# Patient Record
Sex: Female | Born: 1970 | Race: White | Hispanic: No | Marital: Single | Smoking: Never smoker
Health system: Southern US, Community
[De-identification: ages and names within clinical notes are randomized; demographics above are authoritative.]

## PROBLEM LIST (undated history)

## (undated) DIAGNOSIS — E039 Hypothyroidism, unspecified: Secondary | ICD-10-CM

## (undated) DIAGNOSIS — T7840XA Allergy, unspecified, initial encounter: Secondary | ICD-10-CM

## (undated) DIAGNOSIS — R011 Cardiac murmur, unspecified: Secondary | ICD-10-CM

## (undated) HISTORY — DX: Allergy, unspecified, initial encounter: T78.40XA

## (undated) HISTORY — DX: Hypothyroidism, unspecified: E03.9

## (undated) HISTORY — PX: CERVICAL POLYPECTOMY: SHX88

## (undated) HISTORY — DX: Cardiac murmur, unspecified: R01.1

---

## 1996-10-30 ENCOUNTER — Encounter: Payer: Self-pay | Admitting: Internal Medicine

## 1998-04-08 ENCOUNTER — Other Ambulatory Visit: Admission: RE | Admit: 1998-04-08 | Discharge: 1998-04-08 | Payer: Self-pay | Admitting: Obstetrics & Gynecology

## 1999-07-28 ENCOUNTER — Other Ambulatory Visit: Admission: RE | Admit: 1999-07-28 | Discharge: 1999-07-28 | Payer: Self-pay | Admitting: Obstetrics & Gynecology

## 2000-09-30 ENCOUNTER — Other Ambulatory Visit: Admission: RE | Admit: 2000-09-30 | Discharge: 2000-09-30 | Payer: Self-pay | Admitting: Obstetrics and Gynecology

## 2002-01-22 ENCOUNTER — Other Ambulatory Visit: Admission: RE | Admit: 2002-01-22 | Discharge: 2002-01-22 | Payer: Self-pay | Admitting: Obstetrics and Gynecology

## 2003-02-08 ENCOUNTER — Other Ambulatory Visit: Admission: RE | Admit: 2003-02-08 | Discharge: 2003-02-08 | Payer: Self-pay | Admitting: Obstetrics and Gynecology

## 2004-02-09 ENCOUNTER — Other Ambulatory Visit: Admission: RE | Admit: 2004-02-09 | Discharge: 2004-02-09 | Payer: Self-pay | Admitting: Obstetrics and Gynecology

## 2004-09-15 ENCOUNTER — Ambulatory Visit: Payer: Self-pay | Admitting: Internal Medicine

## 2005-02-21 ENCOUNTER — Other Ambulatory Visit: Admission: RE | Admit: 2005-02-21 | Discharge: 2005-02-21 | Payer: Self-pay | Admitting: Obstetrics and Gynecology

## 2005-10-16 ENCOUNTER — Ambulatory Visit: Payer: Self-pay | Admitting: Internal Medicine

## 2005-10-23 ENCOUNTER — Ambulatory Visit: Payer: Self-pay | Admitting: Internal Medicine

## 2006-01-24 ENCOUNTER — Ambulatory Visit: Payer: Self-pay | Admitting: Internal Medicine

## 2006-01-28 ENCOUNTER — Ambulatory Visit: Payer: Self-pay | Admitting: Internal Medicine

## 2006-03-05 ENCOUNTER — Ambulatory Visit: Payer: Self-pay | Admitting: Internal Medicine

## 2006-07-04 ENCOUNTER — Ambulatory Visit: Payer: Self-pay | Admitting: Internal Medicine

## 2006-07-18 ENCOUNTER — Ambulatory Visit: Payer: Self-pay | Admitting: Internal Medicine

## 2007-01-10 ENCOUNTER — Ambulatory Visit: Payer: Self-pay | Admitting: Internal Medicine

## 2007-01-10 LAB — CONVERTED CEMR LAB
BUN: 12 mg/dL (ref 6–23)
Basophils Relative: 0.5 % (ref 0.0–1.0)
Bilirubin, Direct: 0.1 mg/dL (ref 0.0–0.3)
CO2: 28 meq/L (ref 19–32)
Calcium: 9 mg/dL (ref 8.4–10.5)
Eosinophils Absolute: 0.2 10*3/uL (ref 0.0–0.6)
Eosinophils Relative: 3.1 % (ref 0.0–5.0)
GFR calc Af Amer: 91 mL/min
GFR calc non Af Amer: 75 mL/min
Glucose, Bld: 86 mg/dL (ref 70–99)
HDL: 46.9 mg/dL (ref >39.0)
Hemoglobin: 12.9 g/dL (ref 12.0–15.0)
Lymphocytes Relative: 29.4 % (ref 12.0–46.0)
MCV: 89 fL (ref 78.0–100.0)
Monocytes Absolute: 0.3 10*3/uL (ref 0.2–0.7)
Monocytes Relative: 6.5 % (ref 3.0–11.0)
Neutro Abs: 3 10*3/uL (ref 1.4–7.7)
Platelets: 131 10*3/uL — ABNORMAL LOW (ref 150–400)
Potassium: 3.9 meq/L (ref 3.5–5.1)
TSH: 2.12 microintl units/mL (ref 0.35–5.50)
Total Protein: 7.2 g/dL (ref 6.0–8.3)
Triglycerides: 73 mg/dL (ref 0–149)
VLDL: 15 mg/dL (ref 0–40)

## 2007-01-17 ENCOUNTER — Ambulatory Visit: Payer: Self-pay | Admitting: Internal Medicine

## 2007-03-12 ENCOUNTER — Ambulatory Visit: Payer: Self-pay | Admitting: Endocrinology

## 2007-03-12 LAB — CONVERTED CEMR LAB
Basophils Absolute: 0 10*3/uL (ref 0.0–0.1)
Basophils Relative: 0.4 % (ref 0.0–1.0)
Eosinophils Relative: 1.8 % (ref 0.0–5.0)
HCT: 35.3 % — ABNORMAL LOW (ref 36.0–46.0)
Hemoglobin: 12.3 g/dL (ref 12.0–15.0)
Monocytes Absolute: 0.5 10*3/uL (ref 0.2–0.7)
Neutrophils Relative %: 68.9 % (ref 43.0–77.0)
RBC: 3.95 M/uL (ref 3.87–5.11)
RDW: 11.5 % (ref 11.5–14.6)
WBC: 8.2 10*3/uL (ref 4.5–10.5)

## 2007-05-09 ENCOUNTER — Encounter (INDEPENDENT_AMBULATORY_CARE_PROVIDER_SITE_OTHER): Payer: Self-pay | Admitting: Obstetrics and Gynecology

## 2007-05-09 ENCOUNTER — Ambulatory Visit (HOSPITAL_COMMUNITY): Admission: RE | Admit: 2007-05-09 | Discharge: 2007-05-09 | Payer: Self-pay | Admitting: Obstetrics and Gynecology

## 2007-10-08 ENCOUNTER — Ambulatory Visit: Payer: Self-pay | Admitting: Internal Medicine

## 2007-10-08 DIAGNOSIS — R519 Headache, unspecified: Secondary | ICD-10-CM | POA: Insufficient documentation

## 2007-10-08 DIAGNOSIS — M542 Cervicalgia: Secondary | ICD-10-CM | POA: Insufficient documentation

## 2007-10-08 DIAGNOSIS — R51 Headache: Secondary | ICD-10-CM

## 2007-10-08 DIAGNOSIS — E039 Hypothyroidism, unspecified: Secondary | ICD-10-CM

## 2008-04-29 ENCOUNTER — Telehealth: Payer: Self-pay | Admitting: *Deleted

## 2008-05-28 ENCOUNTER — Ambulatory Visit: Payer: Self-pay | Admitting: Internal Medicine

## 2008-05-28 DIAGNOSIS — F4321 Adjustment disorder with depressed mood: Secondary | ICD-10-CM

## 2008-11-12 ENCOUNTER — Telehealth: Payer: Self-pay | Admitting: *Deleted

## 2008-12-13 ENCOUNTER — Telehealth: Payer: Self-pay | Admitting: Internal Medicine

## 2008-12-13 ENCOUNTER — Ambulatory Visit: Payer: Self-pay | Admitting: Internal Medicine

## 2008-12-13 DIAGNOSIS — J309 Allergic rhinitis, unspecified: Secondary | ICD-10-CM

## 2008-12-13 DIAGNOSIS — J019 Acute sinusitis, unspecified: Secondary | ICD-10-CM

## 2008-12-13 LAB — CONVERTED CEMR LAB
Basophils Relative: 1 % (ref 0.0–3.0)
Eosinophils Relative: 0.8 % (ref 0.0–5.0)
HCT: 36.7 % (ref 36.0–46.0)
Hemoglobin: 12.9 g/dL (ref 12.0–15.0)
MCHC: 35.2 g/dL (ref 30.0–36.0)
MCV: 89.6 fL (ref 78.0–100.0)
Monocytes Absolute: 0.6 10*3/uL (ref 0.1–1.0)
Neutro Abs: 7.9 10*3/uL — ABNORMAL HIGH (ref 1.4–7.7)
Neutrophils Relative %: 78.6 % — ABNORMAL HIGH (ref 43.0–77.0)
RBC: 4.09 M/uL (ref 3.87–5.11)
WBC: 10.1 10*3/uL (ref 4.5–10.5)

## 2009-02-01 ENCOUNTER — Ambulatory Visit: Payer: Self-pay | Admitting: Internal Medicine

## 2009-02-01 DIAGNOSIS — J069 Acute upper respiratory infection, unspecified: Secondary | ICD-10-CM | POA: Insufficient documentation

## 2009-02-01 DIAGNOSIS — D696 Thrombocytopenia, unspecified: Secondary | ICD-10-CM

## 2009-02-01 HISTORY — DX: Thrombocytopenia, unspecified: D69.6

## 2009-05-13 ENCOUNTER — Ambulatory Visit: Payer: Self-pay | Admitting: Internal Medicine

## 2009-05-13 DIAGNOSIS — S99919A Unspecified injury of unspecified ankle, initial encounter: Secondary | ICD-10-CM

## 2009-05-13 DIAGNOSIS — S99929A Unspecified injury of unspecified foot, initial encounter: Secondary | ICD-10-CM

## 2009-05-13 DIAGNOSIS — S8990XA Unspecified injury of unspecified lower leg, initial encounter: Secondary | ICD-10-CM | POA: Insufficient documentation

## 2010-04-12 ENCOUNTER — Ambulatory Visit: Payer: Self-pay | Admitting: Internal Medicine

## 2010-04-12 LAB — CONVERTED CEMR LAB
Basophils Absolute: 0 10*3/uL (ref 0.0–0.1)
Basophils Relative: 0.4 % (ref 0.0–3.0)
Eosinophils Absolute: 0.2 10*3/uL (ref 0.0–0.7)
MCHC: 34.5 g/dL (ref 30.0–36.0)
MCV: 90.1 fL (ref 78.0–100.0)
Monocytes Absolute: 0.7 10*3/uL (ref 0.1–1.0)
Neutro Abs: 6.1 10*3/uL (ref 1.4–7.7)
Neutrophils Relative %: 69 % (ref 43.0–77.0)
RBC: 3.86 M/uL — ABNORMAL LOW (ref 3.87–5.11)
RDW: 12.2 % (ref 11.5–14.6)

## 2010-04-19 ENCOUNTER — Ambulatory Visit: Payer: Self-pay | Admitting: Internal Medicine

## 2010-04-19 DIAGNOSIS — R635 Abnormal weight gain: Secondary | ICD-10-CM | POA: Insufficient documentation

## 2010-04-19 DIAGNOSIS — R5381 Other malaise: Secondary | ICD-10-CM | POA: Insufficient documentation

## 2010-04-19 DIAGNOSIS — R5383 Other fatigue: Secondary | ICD-10-CM

## 2010-06-01 ENCOUNTER — Telehealth: Payer: Self-pay | Admitting: *Deleted

## 2010-09-20 ENCOUNTER — Ambulatory Visit
Admission: RE | Admit: 2010-09-20 | Discharge: 2010-09-20 | Payer: Self-pay | Source: Home / Self Care | Attending: Internal Medicine | Admitting: Internal Medicine

## 2010-10-24 NOTE — Progress Notes (Signed)
Summary: refill  Phone Note From Pharmacy   Caller: [facility]* Reason for Call: Needs renewal Details for Reason: Fluoxetine 10mg   Initial call taken by: Romualdo Bolk, CMA Duncan Dull),  June 01, 2010 12:05 PM  Follow-up for Phone Call        Rx sent to pharmacy Follow-up by: Romualdo Bolk, CMA (AAMA),  June 01, 2010 12:06 PM    Prescriptions: FLUOXETINE HCL 10 MG CAPS (FLUOXETINE HCL) 1 by mouth once daily  #30 x 4   Entered by:   Romualdo Bolk, CMA (AAMA)   Authorized by:   Madelin Headings MD   Signed by:   Romualdo Bolk, CMA (AAMA) on 06/01/2010   Method used:   Electronically to        [facility]* (retail)       [address] [place], [place]  [address]       Ph: [phone]       Fax: [phone]   RxID:   [id number]

## 2010-10-24 NOTE — Assessment & Plan Note (Signed)
Summary: follow up on labs/ssc   Vital Signs:  Patient profile:   40 year old female Menstrual status:  regular LMP:     03/29/2010 Height:      64.25 inches Weight:      159 pounds BMI:     27.18 Pulse rate:   72 / minute BP sitting:   110 / 70  (right arm) Cuff size:   regular  Vitals Entered By: Romualdo Bolk, CMA (AAMA) (April 19, 2010 4:16 PM) CC: Follow-up visit on labs LMP (date): 03/29/2010 LMP - Character: normal Menarche (age onset years): 12   Menses interval (days): 28 Menstrual flow (days): 4 Enter LMP: 03/29/2010   History of Present Illness: Lisa Bradford comes in today  for follow up of thyroid and  blood countthat showed somewhat low plt count in the  past. Since last visit  here  there have been no major changes in health status  and no bleeding or unsual illness. No change in thyroid med and is on brand   . She is tired a good bit but no cv pulm localization. Mood Stable wants to stay on med  Weight gain  :  habit change   but no edema   has been on ocps per Dr Su Hilt .    Preventive Screening-Counseling & Management  Alcohol-Tobacco     Alcohol drinks/day: <1     Smoking Status: never  Caffeine-Diet-Exercise     Caffeine use/day: 2     Does Patient Exercise: no  Current Medications (verified): 1)  Synthroid 75 Mcg Tabs (Levothyroxine Sodium) .Marland Kitchen.. 1 Tablet By Mouth Once A Day. 2)  Cyclobenzaprine Hcl 10 Mg  Tabs (Cyclobenzaprine Hcl) .... 1/2 To 1 Three Times A Day As Needed For Neck Pain. 3)  Flonase 50 Mcg/act Susp (Fluticasone Propionate) .... 2 Sprays Each Nares Q D      For Allergy and Sinusitis 4)  Fluoxetine Hcl 10 Mg Caps (Fluoxetine Hcl) .Marland Kitchen.. 1 By Mouth Once Daily  Allergies (verified): No Known Drug Allergies  Past History:  Past medical, surgical, family and social histories (including risk factors) reviewed for relevance to current acute and chronic problems.  Past Medical History: Reviewed history from 02/01/2009 and no  changes required. Hypothyroidism  goiter with positive antibodies heart murmur echo in 98 and hx seasonal allergies and a Low p[atelet count x 2 2009   CONSULTANTS  Dr  Osborn Coho gyne  Past Surgical History: Reviewed history from 10/08/2007 and no changes required. Cervical and Endometrial Polyps removed  Past History:  Care Management: Gynecology: Dr. Su Hilt  Family History: Reviewed history from 05/28/2008 and no changes required. Family History of Cervical cancer Mom Family History Lung cancer- Grandmother and Grandfather- Maternal Family History Ovarian cancer- Mom Family History Other cancer- Pancreatic Cancer- Grandfather- maternal Family History Diabetes 1st degree relative- Father and Paternal Grandfather and Maternal Grandfather  Father with Heart   stent  CAD  age 14  Social History: Reviewed history from 02/01/2009 and no changes required. Single Never Smoked Alcohol use-no   Drug use-no Regular exercise-no HH of 1   no pets  college degree  Review of Systems  The patient denies anorexia, fever, weight loss, vision loss, decreased hearing, chest pain, peripheral edema, difficulty walking, depression, abnormal bleeding, enlarged lymph nodes, and angioedema.         skin on heel   right foot  colred no pain or injury  Physical Exam  General:  Well-developed,well-nourished,in no acute distress;  alert,appropriate and cooperative throughout examination Head:  normocephalic and atraumatic.   Eyes:  some prottosis and no lid lag.  Neck:  No deformities, , or tenderness noted. goiter non tender  Lungs:  Normal respiratory effort, chest expands symmetrically. Lungs are clear to auscultation, no crackles or wheezes. Heart:  Normal rate and regular rhythm. S1 and S2 normal without gallop, murmur, click, rub or other extra sounds. Abdomen:  Bowel sounds positive,abdomen soft and non-tender without masses, organomegaly or   noted. Pulses:  pulses intact  without delay   Extremities:  no clubbing cyanosis or edema  Neurologic:  non focal  Skin:  righ foor 1 cm blanching blood flat rash with  skin lines non tender and not indureated  Cervical Nodes:  No lymphadenopathy noted Psych:  Oriented X3, normally interactive, and good eye contact.     Impression & Recommendations:  Problem # 1:  HYPOTHYROIDISM (ICD-244.9)  Her updated medication list for this problem includes:    Synthroid 75 Mcg Tabs (Levothyroxine sodium) .Marland Kitchen... 1 tablet by mouth once a day.  Labs Reviewed: TSH: 2.67 (04/12/2010)    Chol: 149 (01/10/2007)   HDL: 46.9 (01/10/2007)   LDL: 88 (01/10/2007)   TG: 73 (01/10/2007)  Problem # 2:  WEIGHT GAIN (ICD-783.1) counseled    20# in a year    tsh does not explain this . suppose prozac could contribute but not in the past .  lifestyle intervention  rec   Problem # 3:  MALAISE AND FATIGUE (ICD-780.79) prob combo related  to weight gain etc    no obv cause  otherwise   disc  this   Problem # 4:  periods   on   generic  equivalent of ocp ?  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 1 tablet by mouth once a day. 2)  Cyclobenzaprine Hcl 10 Mg Tabs (Cyclobenzaprine hcl) .... 1/2 to 1 three times a day as needed for neck pain. 3)  Flonase 50 Mcg/act Susp (Fluticasone propionate) .... 2 sprays each nares q d      for allergy and sinusitis 4)  Fluoxetine Hcl 10 Mg Caps (Fluoxetine hcl) .Marland Kitchen.. 1 by mouth once daily  Patient Instructions: 1)  You need to use up 3500 calories more than intake to lose one pound of body weight.  2)  look at nutrition and exercise. 3)  Recheck  TSH in 6 months    .   Prescriptions: SYNTHROID 75 MCG TABS (LEVOTHYROXINE SODIUM) 1 tablet by mouth once a day. Brand medically necessary #30 Tablet x 12   Entered  and Authorized by:   Madelin Headings MD   Signed by:   Madelin Headings MD on 04/19/2010   Method used:   Print then Give to Patient   RxID:   [id number]

## 2010-10-26 NOTE — Assessment & Plan Note (Signed)
Summary: ?fungal inf underneath toenail/cjr   Vital Signs:  Patient profile:   40 year old female Menstrual status:  regular LMP:     08/30/2010 Weight:      160 pounds Pulse rate:   72 / minute BP sitting:   120 / 80  (right arm) Cuff size:   regular  Vitals Entered By: Romualdo Bolk, CMA (AAMA) (September 20, 2010 3:23 PM) CC: ? Fungus on Rt big- Has been there for several months LMP (date): 08/30/2010 LMP - Character: normal Menarche (age onset years): 12   Menses interval (days): 28 Menstrual flow (days): 4 Enter LMP: 08/30/2010   History of Present Illness: Lisa Bradford   comes in today  for acute problem that has been there for a few months. toe nail right great discolored and thickened and ? some worse. denies trauma but goes for reg pedicures and keeps naisl painted .   no other nail involved except a slight change in left great toe. NO pain  Preventive Screening-Counseling & Management  Alcohol-Tobacco     Alcohol drinks/day: <1     Smoking Status: never  Caffeine-Diet-Exercise     Caffeine use/day: 2     Does Patient Exercise: no  Current Medications (verified): 1)  Synthroid 75 Mcg Tabs (Levothyroxine Sodium) .Marland Kitchen.. 1 Tablet By Mouth Once A Day. 2)  Cyclobenzaprine Hcl 10 Mg  Tabs (Cyclobenzaprine Hcl) .... 1/2 To 1 Three Times A Day As Needed For Neck Pain. 3)  Flonase 50 Mcg/act Susp (Fluticasone Propionate) .... 2 Sprays Each Nares Q D      For Allergy and Sinusitis 4)  Fluoxetine Hcl 10 Mg Caps (Fluoxetine Hcl) .Marland Kitchen.. 1 By Mouth Once Daily  Allergies (verified): No Known Drug Allergies  Past History:  Past medical, surgical, family and social histories (including risk factors) reviewed for relevance to current acute and chronic problems.  Past Medical History: Reviewed history from 02/01/2009 and no changes required. Hypothyroidism  goiter with positive antibodies heart murmur echo in 98 and hx seasonal allergies and a Low p[atelet count x 2  2009   CONSULTANTS  Dr  Osborn Coho gyne  Past Surgical History: Reviewed history from 10/08/2007 and no changes required. Cervical and Endometrial Polyps removed  Past History:  Care Management: Gynecology: Dr. Su Hilt  Family History: Reviewed history from 05/28/2008 and no changes required. Family History of Cervical cancer Mom Family History Lung cancer- Grandmother and Grandfather- Maternal Family History Ovarian cancer- Mom Family History Other cancer- Pancreatic Cancer- Grandfather- maternal Family History Diabetes 1st degree relative- Father and Paternal Grandfather and Maternal Grandfather  Father with Heart   stent  CAD  age 29  Social History: Reviewed history from 02/01/2009 and no changes required. Single Never Smoked Alcohol use-no   Drug use-no Regular exercise-no HH of 1   no pets  college degree  Review of Systems  The patient denies anorexia, fever, abnormal bleeding, enlarged lymph nodes, and angioedema.    Physical Exam  General:  Well-developed,well-nourished,in no acute distress; alert,appropriate and cooperative throughout examination Lungs:  normal respiratory effort and no intercostal retractions.   Msk:  no joint swelling and no joint warmth.   Skin:  right great toe with  irreg yellow color and some mild thickening  no redness and nail base is clean . rest of nails normal  excep slight whit are on left great toenail  Psych:  Oriented X3, good eye contact, and not anxious appearing.     Impression & Recommendations:  Problem # 1:  ? of DERMATOPHYTOSIS OF NAIL (ICD-110.1) disc options rx and culture if needed    must avoid the pedicures and painting and will take a while to resolve for now conservative rx  disc use of lamisil and need for more intensive rx    Her updated medication list for this problem includes:    Ciclopirox 8 % Soln (Ciclopirox) .Marland Kitchen... Apply to affected nail once daily for 7 days then remove with alcohol and repeat.     Complete Medication List: 1)  Synthroid 75 Mcg Tabs (Levothyroxine sodium) .Marland Kitchen.. 1 tablet by mouth once a day. 2)  Cyclobenzaprine Hcl 10 Mg Tabs (Cyclobenzaprine hcl) .... 1/2 to 1 three times a day as needed for neck pain. 3)  Flonase 50 Mcg/act Susp (Fluticasone propionate) .... 2 sprays each nares q d      for allergy and sinusitis 4)  Fluoxetine Hcl 10 Mg Caps (Fluoxetine hcl) .Marland Kitchen.. 1 by mouth once daily 5)  Ciclopirox 8 % Soln (Ciclopirox) .... Apply to affecgted nail once daily for 7 days then remove with alcohol and repeat.  Patient Instructions: 1)  avoid pedicures and polish or other chemical s except treatment keep nails clipped carefully. 2)  if not growing out in 3 months or  co wish other treatment call  for reevaluation. Prescriptions: CICLOPIROX 8 % SOLN (CICLOPIROX) apply to affecgted nail once daily for 7 days then remove with alcohol and repeat.  #1 bottle x 2   Entered and Authorized by:   Madelin Headings MD   Signed by:   Madelin Headings MD on 09/20/2010   Method used:   Electronically to        [facility]* (retail)       [address] [place], [place]  [address]       Ph: [phone]       Fax: [phone]   RxID:   [id number]    Orders Added: 1)  Est. Patient Level III [id number]

## 2011-01-29 ENCOUNTER — Telehealth: Payer: Self-pay | Admitting: *Deleted

## 2011-01-29 MED ORDER — FLUOXETINE HCL 10 MG PO CAPS: 10.0000 mg | ORAL_CAPSULE | Freq: Every day | ORAL | Status: DC

## 2011-01-29 NOTE — Telephone Encounter (Signed)
Pt is due for a TSH. Letter mailed to pt to get this done.

## 2011-02-06 NOTE — Op Note (Signed)
NAMEAVE, SCHARNHORST             ACCOUNT NO.:  [id number]   MEDICAL RECORD NO.:  [id number]          PATIENT TYPE:  AMB   LOCATION:  SDC                           FACILITY:  [facility]   PHYSICIAN:  Osborn Coho, M.D.   DATE OF BIRTH:  10/19/1970   DATE OF PROCEDURE:  05/09/2007  DATE OF DISCHARGE:                               OPERATIVE REPORT   PREOPERATIVE DIAGNOSIS:  Metrorrhagia.   POSTOPERATIVE DIAGNOSIS:  Metrorrhagia.   PROCEDURE:  1. Hysteroscopy.  2. Dilation and curettage.   ATTENDING:  Osborn Coho, M.D.   ANESTHESIA:  General via LMA.   FINDINGS:  Polypoid tissue.   SPECIMENS TO PATHOLOGY:  Endometrial curettings.   FLUIDS:  1000 mL.   URINE OUTPUT:  Approximately 100 mL via straight catheterization prior  to procedure.   ESTIMATED BLOOD LOSS:  Minimal.   COMPLICATIONS:  None.   FINDINGS:  Uterus sounded to approximately 8 cm.   PROCEDURE:  The patient was taken to the operating room after the risks,  benefits and alternatives were reviewed with the patient.  The patient  verbalized understanding and consent signed and witnessed.  The patient  was placed under general per Anesthesia and prepped and draped in the  normal sterile fashion in the dorsal lithotomy position.  A bivalve  speculum placed in the patient's vagina and the anterior lip of the  cervix grasped with a single-tooth tenaculum.  A paracervical block was  administered using a total of 10 mL of 1% lidocaine.  The cervix was  dilated for passage of the hysteroscope after the uterus was sounded to  8 cm.  The hysteroscope was introduced and polypoid tissue noted.  Gentle curettage was performed until a gritty texture was noted.  Hysteroscope was  reintroduced and no polyps remained.  All instruments were removed.  There was good hemostasis at tenaculum sites.  Count was correct.  The  patient tolerated procedure well and was returned to the recovery room  in good condition.      Osborn Coho, M.D.  Electronically Signed     AR/MEDQ  D:  05/09/2007  T:  05/10/2007  Job:  [id number]

## 2011-02-06 NOTE — Consult Note (Signed)
[facility]                          ENDOCRINOLOGY CONSULTATION   NAME:Lisa Bradford, Lisa Bradford                    MRN:          [id number]  DATE:03/12/2007                            DOB:          08-Apr-1971    REFERRING PHYSICIAN:  Neta Mends. Panosh, MD   HISTORY OF PRESENT ILLNESS:  A 40 year old woman who reports a 4-year  history of hypothyroidism.  She has been on Synthroid 75 mcg a day for  several years.  She feels well in general, but she has several years of  slight prominence of the left eye without any associated prominence of  the thyroid.   PAST MEDICAL HISTORY:  Contraception.   MEDICATIONS:  Only the Synthroid 75 mcg a day.   SOCIAL HISTORY:  She is single.  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   FAMILY HISTORY:  Her mother has a history of hypothyroidism.   REVIEW OF SYSTEMS:  She has slight weight gain over the past few years.  She denies fever, excessive diaphoresis, palpitations and tremor.   PHYSICAL EXAMINATION:  VITAL SIGNS:  Blood pressure 90/59, heart rate  71, temperature 98.3.  The weight is 143.  GENERAL:  Healthy-appearing young woman in no distress.  SKIN:  No rash.  Not diaphoretic.  On her anterior tibial area, she has  no dermopathy.  HEENT:  Eyes - slight proptosis, left eye greater than the right.  NECK:  Thyroid minimally enlarged on the right.  No nodule.  CHEST:  Clear to auscultation.  No respiratory distress.  CARDIOVASCULAR:  No edema.  Regular rate and rhythm.  No murmur.  NEUROLOGIC:  Alert and oriented.  Does not appear anxious nor depressed.  Sensation is intact to touch on the feet.   LABORATORY DATA:  Laboratory studies forwarded by Dr. Fabian Sharp on January 10, 2007 revealed TSH normal at 2.1, platelets 131.   IMPRESSION:  1. Well-replaced chronic hypothyroidism.  2. Slight left-sided proptosis.  3. Thrombocytopenia, resolved.  This is usually auto-immune.   PLAN:  1. Same Synthroid.  2. She should have an annual physical examination of the thyroid and      an annual TSH.  3. Return here p.r.n.  4. We had a discussion about the natural history of thyroid eye      disease.  I have told her that there is no safe and effective      therapy for this, but fortunately episodes of this threatening      someone's vision are extremely rare.     Sean A. Everardo All, MD  Electronically Signed    SAE/MedQ  DD: 03/16/2007  DT: 03/17/2007  Job #: [id number]   cc:   Neta Mends. Fabian Sharp, MD

## 2011-02-07 ENCOUNTER — Other Ambulatory Visit: Payer: Self-pay

## 2011-02-12 ENCOUNTER — Other Ambulatory Visit (INDEPENDENT_AMBULATORY_CARE_PROVIDER_SITE_OTHER): Payer: Self-pay

## 2011-02-12 ENCOUNTER — Other Ambulatory Visit: Payer: Self-pay

## 2011-02-12 DIAGNOSIS — E039 Hypothyroidism, unspecified: Secondary | ICD-10-CM

## 2011-02-12 LAB — TSH: TSH: 3.17 u[IU]/mL (ref 0.35–5.50)

## 2011-02-26 ENCOUNTER — Encounter: Payer: Self-pay | Admitting: *Deleted

## 2011-02-27 ENCOUNTER — Telehealth: Payer: Self-pay | Admitting: *Deleted

## 2011-02-27 MED ORDER — FLUOXETINE HCL 10 MG PO CAPS: 10.0000 mg | ORAL_CAPSULE | Freq: Every day | ORAL | Status: DC

## 2011-02-27 NOTE — Telephone Encounter (Signed)
rx sent to pharmacy

## 2011-04-20 ENCOUNTER — Other Ambulatory Visit: Payer: Self-pay | Admitting: Internal Medicine

## 2011-05-08 ENCOUNTER — Telehealth: Payer: Self-pay | Admitting: *Deleted

## 2011-05-08 MED ORDER — FLUOXETINE HCL 10 MG PO CAPS: ORAL_CAPSULE | ORAL | Status: DC

## 2011-05-08 NOTE — Telephone Encounter (Signed)
Rx sent to pharmacy   

## 2011-06-04 ENCOUNTER — Other Ambulatory Visit: Payer: Self-pay | Admitting: Internal Medicine

## 2011-06-28 ENCOUNTER — Telehealth: Payer: Self-pay | Admitting: *Deleted

## 2011-06-28 MED ORDER — FLUOXETINE HCL 10 MG PO CAPS: ORAL_CAPSULE | ORAL | Status: DC

## 2011-06-28 NOTE — Telephone Encounter (Signed)
Pt needs to schedule a follow up appt before next refill. Only gave pt 15 tabs b/c we told her last time to schedule an appt.

## 2011-07-06 LAB — CBC
HCT: 36.2
Hemoglobin: 12.9
MCV: 87.5
RDW: 12.1
WBC: 6.2

## 2011-08-04 ENCOUNTER — Other Ambulatory Visit: Payer: Self-pay | Admitting: Internal Medicine

## 2011-08-31 ENCOUNTER — Other Ambulatory Visit: Payer: Self-pay | Admitting: Internal Medicine

## 2011-09-14 ENCOUNTER — Ambulatory Visit (INDEPENDENT_AMBULATORY_CARE_PROVIDER_SITE_OTHER): Payer: BC Managed Care – PPO | Admitting: Family Medicine

## 2011-09-14 ENCOUNTER — Encounter: Payer: Self-pay | Admitting: Family Medicine

## 2011-09-14 VITALS — BP 96/70 | Temp 98.6°F | Wt 175.0 lb

## 2011-09-14 DIAGNOSIS — J019 Acute sinusitis, unspecified: Secondary | ICD-10-CM

## 2011-09-14 MED ORDER — AMOXICILLIN-POT CLAVULANATE 875-125 MG PO TABS: 1.0000 | ORAL_TABLET | Freq: Two times a day (BID) | ORAL | Status: AC

## 2011-09-14 NOTE — Progress Notes (Signed)
  Subjective:    Patient ID: Lisa Bradford, female    DOB: [birth year], 39 y.o.   MRN: [id number]  HPI 40 year old white female, nonsmoker, patient of Dr. Fabian Sharp is in today with complaints of cough, congestion, chills, fatigue, sinus pressure and pain has been going on for 11 days. She has used over-the-counter Coricidin, Mucinex had a nasal rinse and her symptoms remained the same.   Review of Systems  Constitutional: Positive for fatigue.  HENT: Positive for congestion, sneezing, postnasal drip and sinus pressure.   Eyes: Negative.   Respiratory: Positive for cough.   Cardiovascular: Negative.   Skin: Negative.   Neurological: Negative.        Past Medical History  Diagnosis Date  . Allergy   . Heart murmur   . Hypothyroidism     History   Social History  . Marital Status: Single    Spouse Name: N/A    Number of Children: N/A  . Years of Education: N/A   Occupational History  . Not on file.   Social History Main Topics  . Smoking status: Never Smoker   . Smokeless tobacco: Not on file  . Alcohol Use: No  . Drug Use: No  . Sexually Active:    Other Topics Concern  . Not on file   Social History Narrative  . No narrative on file    Past Surgical History  Procedure Date  . Cervical polypectomy     Family History  Problem Relation Age of Onset  . Cancer Mother     ovarian and cervical  . Cancer Maternal Grandmother     lung  . Cancer Maternal Grandfather     lung and pancreatic    Allergies not on file  Current Outpatient Prescriptions on File Prior to Visit  Medication Sig Dispense Refill  . FLUoxetine (PROZAC) 10 MG capsule TAKE 1 CAPSULE BY MOUTH ONCE DAILY  15 capsule  0  . fluticasone (FLONASE) 50 MCG/ACT nasal spray INSTILL 2 SPRAYS IN EACH NOSTRIL EVERY DAY AS NEEDED FOR ALLERGIES AND SINUSITIS  16 g  5  . SYNTHROID 75 MCG tablet TAKE 1 TABLET BY MOUTH ONCE A DAY  30 tablet  0    BP 96/70  Temp(Src) 98.6 F (37 C) (Oral)  Wt 175 lb  (79.379 kg)chart Objective:   Physical Exam  Constitutional: She is oriented to person, place, and time. She appears well-developed and well-nourished.  HENT:  Right Ear: External ear normal.  Left Ear: External ear normal.  Mouth/Throat: Oropharynx is clear and moist.  Cardiovascular: Normal rate, regular rhythm and normal heart sounds.   Pulmonary/Chest: Effort normal and breath sounds normal.  Neurological: She is alert and oriented to person, place, and time.  Skin: Skin is warm and dry.  Psychiatric: She has a normal mood and affect.          Assessment & Plan:  Assessment: Acute Sinusitis  Plan: Amoxil 500 mg 2 by mouth twice a day x10 days. Continue over-the-counter therapy. Rest. Drink plenty of fluids. Call if symptoms worsen or persist, recheck as scheduled and when necessary

## 2011-09-20 ENCOUNTER — Other Ambulatory Visit: Payer: Self-pay | Admitting: Internal Medicine

## 2011-09-27 ENCOUNTER — Telehealth: Payer: Self-pay | Admitting: *Deleted

## 2011-09-27 NOTE — Telephone Encounter (Signed)
Pt has been nauseated.  She vomited this morning at 9.  No fever but does have a h/a.  Pt would like a rx for phenergan called into [facility].  She has been taking pepto with no relief

## 2011-09-28 MED ORDER — PROMETHAZINE HCL 25 MG PO TABS: ORAL_TABLET | ORAL | Status: DC

## 2011-09-28 NOTE — Telephone Encounter (Signed)
This message was sent to my desk top  when I was not  In the office . In the future do not send directly to my desk top . Send to covering nurse first.  Thank you Can rx phenergan 25 mg 1 po q4-6 hours prn nausea .disp 15 no refill

## 2011-09-28 NOTE — Telephone Encounter (Signed)
Pt aware and rx sent to pharmacy. 

## 2011-10-17 ENCOUNTER — Other Ambulatory Visit: Payer: Self-pay | Admitting: Internal Medicine

## 2011-10-29 ENCOUNTER — Other Ambulatory Visit: Payer: Self-pay | Admitting: Internal Medicine

## 2011-11-03 ENCOUNTER — Other Ambulatory Visit: Payer: Self-pay | Admitting: Internal Medicine

## 2011-12-10 ENCOUNTER — Other Ambulatory Visit: Payer: Self-pay | Admitting: Internal Medicine

## 2011-12-19 ENCOUNTER — Other Ambulatory Visit (INDEPENDENT_AMBULATORY_CARE_PROVIDER_SITE_OTHER): Payer: BC Managed Care – PPO

## 2011-12-19 DIAGNOSIS — Z0000 Encounter for general adult medical examination without abnormal findings: Secondary | ICD-10-CM

## 2011-12-19 LAB — BASIC METABOLIC PANEL
CO2: 27 mEq/L (ref 19–32)
Calcium: 9.1 mg/dL (ref 8.4–10.5)
Creatinine, Ser: 0.8 mg/dL (ref 0.4–1.2)
Glucose, Bld: 85 mg/dL (ref 70–99)

## 2011-12-19 LAB — CBC WITH DIFFERENTIAL/PLATELET
Basophils Absolute: 0 10*3/uL (ref 0.0–0.1)
Eosinophils Absolute: 0.1 10*3/uL (ref 0.0–0.7)
Lymphocytes Relative: 22.6 % (ref 12.0–46.0)
MCHC: 33.3 g/dL (ref 30.0–36.0)
Neutrophils Relative %: 69.5 % (ref 43.0–77.0)
Platelets: 184 10*3/uL (ref 150.0–400.0)
RDW: 12.9 % (ref 11.5–14.6)

## 2011-12-19 LAB — POCT URINALYSIS DIPSTICK
Bilirubin, UA: NEGATIVE
Glucose, UA: NEGATIVE
Leukocytes, UA: NEGATIVE
Protein, UA: NEGATIVE
Spec Grav, UA: >=1.03
Urobilinogen, UA: 0.2
pH, UA: 5

## 2011-12-19 LAB — LIPID PANEL
HDL: 45.1 mg/dL (ref >39.00)
Triglycerides: 116 mg/dL (ref 0.0–149.0)

## 2011-12-19 LAB — HEPATIC FUNCTION PANEL
AST: 32 U/L (ref 0–37)
Albumin: 3.8 g/dL (ref 3.5–5.2)
Total Bilirubin: 0.3 mg/dL (ref 0.3–1.2)

## 2011-12-26 ENCOUNTER — Ambulatory Visit (INDEPENDENT_AMBULATORY_CARE_PROVIDER_SITE_OTHER): Payer: BC Managed Care – PPO | Admitting: Internal Medicine

## 2011-12-26 ENCOUNTER — Encounter: Payer: Self-pay | Admitting: Internal Medicine

## 2011-12-26 VITALS — BP 100/68 | HR 78 | Temp 98.3°F | Ht 65.25 in | Wt 174.0 lb

## 2011-12-26 DIAGNOSIS — J309 Allergic rhinitis, unspecified: Secondary | ICD-10-CM

## 2011-12-26 DIAGNOSIS — K219 Gastro-esophageal reflux disease without esophagitis: Secondary | ICD-10-CM

## 2011-12-26 DIAGNOSIS — B351 Tinea unguium: Secondary | ICD-10-CM

## 2011-12-26 DIAGNOSIS — F4321 Adjustment disorder with depressed mood: Secondary | ICD-10-CM

## 2011-12-26 DIAGNOSIS — Z0000 Encounter for general adult medical examination without abnormal findings: Secondary | ICD-10-CM

## 2011-12-26 DIAGNOSIS — E039 Hypothyroidism, unspecified: Secondary | ICD-10-CM

## 2011-12-26 HISTORY — DX: Gastro-esophageal reflux disease without esophagitis: K21.9

## 2011-12-26 MED ORDER — FLUOXETINE HCL 10 MG PO CAPS: 10.0000 mg | ORAL_CAPSULE | Freq: Every day | ORAL | Status: DC

## 2011-12-26 MED ORDER — TERBINAFINE HCL 250 MG PO TABS: 250.0000 mg | ORAL_TABLET | Freq: Every day | ORAL | Status: AC

## 2011-12-26 NOTE — Patient Instructions (Addendum)
Continue same meds .  Consider weight watchers Ok to take  Zantac but  If getting worse then call us for follow up.  Can take lamisil each day for  Toe nail fungus   return office visit in  6 weeks  To check this    Diet for GERD or PUD Nutrition therapy can help ease the discomfort of gastroesophageal reflux disease (GERD) and peptic ulcer disease (PUD).  HOME CARE INSTRUCTIONS   Eat your meals slowly, in a relaxed setting.   Eat 5 to 6 small meals per day.   If a food causes distress, stop eating it for a period of time.  FOODS TO AVOID  Coffee, regular or decaffeinated.   Cola beverages, regular or low calorie.   Tea, regular or decaffeinated.   Pepper.   Cocoa.   High fat foods, including meats.   Butter, margarine, hydrogenated oil (trans fats).   Peppermint or spearmint (if you have GERD).   Fruits and vegetables if not tolerated.   Alcohol.   Nicotine (smoking or chewing). This is one of the most potent stimulants to acid production in the gastrointestinal tract.   Any food that seems to aggravate your condition.  If you have questions regarding your diet, ask your caregiver or a registered dietitian. TIPS  Lying flat may make symptoms worse. Keep the head of your bed raised 6 to 9 inches (15 to 23 cm) by using a foam wedge or blocks under the legs of the bed.   Do not lay down until 3 hours after eating a meal.   Daily physical activity may help reduce symptoms.  MAKE SURE YOU:   Understand these instructions.   Will watch your condition.   Will get help right away if you are not doing well or get worse.  Document Released: 09/10/2005 Document Revised: 08/30/2011 Document Reviewed: 07/27/2011 [facility] Patient Information 2012 [place], [place].   The Basics of Weight Loss  Decrease the Energy in and Increase the energy out  :   This is a Scientist, product/process development and hasn't been proven incorrect yet  no matter what any one selling a product or program  says.  Decrease    calories you eat and drink , including alcohol. And sweet tea. Anything that goes in the mouth counts.   Increase   exercise or   Any physical movement , Walking is  A great exercise.  (optimize) sleep .  The trick and complexity  is in implementation in  our daily routines and to overcome internal urges that tell us we are hungry when we really are tired ,moody etc.  Medications side effects, sleep deprivation, hidden sugars in foods increase our cravings.  Eating only once a day  decreases our resting " Energy Out" and is associated  with maintaining the  obesity state.  Exercise helps burn energy in but also helps metabolic resting rate and many other functions that help [place] stay healthy.  Sleep deprivation tells [place] we are hungry when our body really needs sleep.   Some of [place] have a genetics that, once we are obese have a much harder time implementing the program . But weight loss is still based on the same principles.  Bypass surgery helps but doesn't not cure obesity . The good effects  can be overcome and weight gain reoccur.  If the person doesn't continue controlling the  Energy in energy out equation. I have seen this in patients years after the initial weight loss .  Weight loss programs are just ways of implementing above.  Medications for weight loss  have been a disappointment with some high risk effects including cost  and take your mental energy away from the task at hand. They can be helpful in some people but rarely give long term success without above(  the hard work)  For this reason I rarely prescribe them. RIsk seems  higher than benefit.  Supplements don't make you lose weight.Save your money ; buy healthy  lower calorie fruits and veges with your money and eat them instead. I have seen patients succeed at any age  But slow and steady wins the race . The biggest loser  Corliss Marcus needs to continue changes for a LIFETIME.   Be accountable and honest with  yourself. Almost every one underestimates their energy intake( what they eat and drink )  From 10-30 %. So if not successful  Look again.   Eating  A few M &Ms require walking the length of a football field to burn this up!  Most adults need much fewer calories to live than we realize . Food adds on tv are ridiculously deceptive about what is a portion size .  Avoid triggers  Don't reward with food. Avoid habit eating mindfulness helps. Break the association of eating with socialization .Fnd activities not centered around food. (like going to lunch with a friend)   Roe Coombs t go out to eat  When you are very hungry. Don't get caught up in the all or nothing " If you cant do it perfectly don't do it at all" mindset.   (If you were drowning  Wouldn't you tread water until you had more energy to swim to shore?)   Simple sugars and carbs make you hungrier in a few hours .   Add protein to your snack/ meal . A 90 calorie( non fried) egg does more to decrease appetie than a piece of white toast with jam  with about the same calories or more

## 2011-12-26 NOTE — Progress Notes (Signed)
Subjective:    Patient ID: Lisa Bradford, female    DOB: 18-Jun-1971, 41 y.o.   MRN: [id number]  HPI Patient comes in today for preventive visit and follow-up of medical issues. Update  history since  last visit: No major changes ; ,injury surgery or hospitalizations. Thyroid no change meds Heart burn acid reflux : taking zantac  andprilosec  For 6 months and  Problematic.  No wakening and no dysphagia.  Usually in evening.  Toe nail fungus getting worse otc no help intererested in oral meds  Moods: prozac helpful to continue Allergy not taking flonase reg . Sees gyne yearly has eye doc  Review of Systems ROS:  GEN/ HEENT: No fever, significant weight changes sweats headaches vision problems hearing changes, CV/ PULM; No chest pain shortness of breath cough, syncope,edema  change in exercise tolerance. GI /GU: No adominal pain, vomiting, change in bowel habits. No blood in the stool. No significant GU symptoms. SKIN/HEME: ,no acute skin rashes suspicious lesions or bleeding. No lymphadenopathy, nodules, masses.  NEURO/ PSYCH:  No neurologic signs such as weakness numbness. No depression anxiety. IMM/ Allergy: No unusual infections.  Allergy .   REST of 12 system review negative except as per HPI  Past history family history social history reviewed in the electronic medical record. Outpatient Prescriptions Prior to Visit  Medication Sig Dispense Refill  . cyclobenzaprine (FLEXERIL) 10 MG tablet Take 10 mg by mouth 3 (three) times daily as needed.        Marland Kitchen FLUoxetine (PROZAC) 10 MG capsule TAKE 1 CAPSULE BY MOUTH ONCE A DAY  15 capsule  3  . fluticasone (FLONASE) 50 MCG/ACT nasal spray INSTILL 2 SPRAYS IN EACH NOSTRIL EVERY DAY AS NEEDED FOR ALLERGIES AND SINUSITIS  16 g  5  . promethazine (PHENERGAN) 25 MG tablet 1 tab q 4-6 hours prn nausea  15 tablet  0  . SYNTHROID 75 MCG tablet TAKE 1 TABLET BY MOUTH ONCE A DAY  30 tablet  0        Objective:   Physical Exam BP 100/68   Pulse 78  Temp 98.3 F (36.8 C)  Ht 5' 5.25" (1.657 m)  Wt 174 lb (78.926 kg)  BMI 28.73 kg/m2  SpO2 98% Physical Exam: Vital signs reviewed WUJ:WJXB is a well-developed well-nourished alert cooperative  white female who appears her stated age in no acute distress.  HEENT: normocephalic atraumatic , Eyes: PERRL EOM's full, conjunctiva clear, Nares: paten,t no deformity discharge or tenderness., Ears: no deformity EAC's clear TMs with normal landmarks. Mouth: clear OP, no lesions, edema.  Moist mucous membranes. Dentition in adequate repair. NECK: supple without masses, thyromegaly or bruits. CHEST/PULM:  Clear to auscultation and percussion breath sounds equal no wheeze , rales or rhonchi. No chest wall deformities or tenderness. CV: PMI is nondisplaced, S1 S2 no gallops, murmurs, rubs. Peripheral pulses are full without delay.No JVD .  Breast: normal by inspection . No dimpling, discharge, masses, tenderness or discharge .  ABDOMEN: Bowel sounds normal nontender  No guard or rebound, no hepato splenomegal no CVA tenderness.  No hernia. Extremtities:  No clubbing cyanosis or edema, no acute joint swelling or redness no focal atrophy NEURO:  Oriented x3, cranial nerves 3-12 appear to be intact, no obvious focal weakness,gait within normal limits no abnormal reflexes or asymmetrical SKIN: No acute rashes normal turgor, color, no bruising or petechiae. Great toe with irreg  Medial thickening and onychomycosis PSYCH: Oriented, good eye contact, no obvious depression anxiety,  cognition and judgment appear normal. LN: no cervical axillary inguinal adenopathy    Lab Results  Component Value Date   WBC 8.7 12/19/2011   HGB 12.5 12/19/2011   HCT 37.5 12/19/2011   PLT 184.0 12/19/2011   GLUCOSE 85 12/19/2011   CHOL 168 12/19/2011   TRIG 116.0 12/19/2011   HDL 45.10 12/19/2011   LDLCALC 100* 12/19/2011   ALT 35 12/19/2011   AST 32 12/19/2011   NA 138 12/19/2011   K 4.2 12/19/2011   CL 104 12/19/2011    CREATININE 0.8 12/19/2011   BUN 12 12/19/2011   CO2 27 12/19/2011   TSH 2.49 12/19/2011   Wt Readings from Last 3 Encounters:  12/26/11 174 lb (78.926 kg)  09/14/11 175 lb (79.379 kg)  09/20/10 160 lb (72.576 kg)           Assessment & Plan:  Preventive Health Care Counseled regarding healthy nutrition, exercise, sleep, injury prevention, calcium vit d and healthy weight .  Hypothyroid continue meds  Allergic rhinitis tae flonase daily onychomycosis right great toe    Risk benefit of medication discussed. Begin meds Mood  No change gerd alarm features discussed and for now can use otc med as needed. Weight loss would help  Counseled.

## 2012-01-14 ENCOUNTER — Other Ambulatory Visit: Payer: Self-pay | Admitting: Internal Medicine

## 2012-03-01 ENCOUNTER — Other Ambulatory Visit: Payer: Self-pay | Admitting: Internal Medicine

## 2012-03-25 ENCOUNTER — Telehealth: Payer: Self-pay | Admitting: Internal Medicine

## 2012-03-25 NOTE — Telephone Encounter (Signed)
Pt has questions about terbinafine (LAMISIL) 250 MG tablet.

## 2012-03-25 NOTE — Telephone Encounter (Signed)
doesn't need liver test because her baseline was normal unless she is having a problem with it.  Ok to just restart this however she should have an OV to check the nail exam after restarting.

## 2012-03-25 NOTE — Telephone Encounter (Signed)
The pt did not realize that lamisil rx was a 3 month.  She only took 1 month.  Has been off the medication for 1 month.  Would like to know if she needs to come in and do liver test before she goes and gets the other 2 months filled or do testing after.  Please advise.

## 2012-03-26 NOTE — Telephone Encounter (Signed)
Spoke to Lisa Bradford.  Explained that she did not need to come in for a liver test at this time.  Instructed after she takes the last of the medication than she needs an OV to have her nail examined by [facility].  If further medication is needed, it will be given at that time.

## 2012-04-01 ENCOUNTER — Ambulatory Visit (INDEPENDENT_AMBULATORY_CARE_PROVIDER_SITE_OTHER): Payer: BC Managed Care – PPO

## 2012-04-01 DIAGNOSIS — Z0000 Encounter for general adult medical examination without abnormal findings: Secondary | ICD-10-CM

## 2012-04-01 DIAGNOSIS — Z23 Encounter for immunization: Secondary | ICD-10-CM

## 2012-07-07 ENCOUNTER — Ambulatory Visit (INDEPENDENT_AMBULATORY_CARE_PROVIDER_SITE_OTHER): Payer: BC Managed Care – PPO | Admitting: Obstetrics and Gynecology

## 2012-07-07 ENCOUNTER — Encounter: Payer: Self-pay | Admitting: Obstetrics and Gynecology

## 2012-07-07 VITALS — BP 120/60 | HR 70 | Resp 16 | Ht 64.0 in | Wt 179.0 lb

## 2012-07-07 DIAGNOSIS — Z124 Encounter for screening for malignant neoplasm of cervix: Secondary | ICD-10-CM

## 2012-07-07 DIAGNOSIS — Z139 Encounter for screening, unspecified: Secondary | ICD-10-CM

## 2012-07-07 DIAGNOSIS — E039 Hypothyroidism, unspecified: Secondary | ICD-10-CM

## 2012-07-07 DIAGNOSIS — Z01419 Encounter for gynecological examination (general) (routine) without abnormal findings: Secondary | ICD-10-CM

## 2012-07-07 LAB — CBC
Hemoglobin: 12.6 g/dL (ref 12.0–15.0)
Platelets: 230 10*3/uL (ref 150–400)
RBC: 4.31 MIL/uL (ref 3.87–5.11)
WBC: 8.8 10*3/uL (ref 4.0–10.5)

## 2012-07-07 NOTE — Progress Notes (Signed)
Contraception IUD Mirena Last pap 06/08/2011 WNL Last Mammo None Last Colonoscopy None Last Dexa Scan None Primary MD Berniece Andreas Abuse at Home None  No complaints - except difficulty loosing wt both of which may be from Mirena  Filed Vitals:   07/07/12 1558  BP: 120/60  Pulse: 70  Resp: 16   ROS: noncontributory  Physical Examination: General appearance - alert, well appearing, and in no distress Neck - supple, no significant adenopathy Chest - clear to auscultation, no wheezes, rales or rhonchi, symmetric air entry Heart - normal rate and regular rhythm Abdomen - soft, nontender, nondistended, no masses or organomegaly Breasts - breasts appear normal, no suspicious masses, no skin or nipple changes or axillary nodes Pelvic - normal external genitalia, vulva, vagina, cervix, uterus and adnexa Back exam - no CVAT Extremities - no edema, redness or tenderness in the calves or thighs  A/P Pap today labs

## 2012-07-08 LAB — PAP IG W/ RFLX HPV ASCU

## 2012-07-08 LAB — VITAMIN D 25 HYDROXY (VIT D DEFICIENCY, FRACTURES): Vit D, 25-Hydroxy: 38 ng/mL (ref 30–89)

## 2012-07-08 LAB — TSH: TSH: 4.035 u[IU]/mL (ref 0.350–4.500)

## 2012-07-11 ENCOUNTER — Other Ambulatory Visit: Payer: Self-pay

## 2012-08-04 ENCOUNTER — Other Ambulatory Visit: Payer: Self-pay

## 2012-08-04 DIAGNOSIS — N63 Unspecified lump in unspecified breast: Secondary | ICD-10-CM

## 2012-08-05 ENCOUNTER — Telehealth: Payer: Self-pay | Admitting: Obstetrics and Gynecology

## 2012-08-05 NOTE — Telephone Encounter (Signed)
Tc to Lisa Bradford per telephone call. Informed Lisa Bradford no doc of phone call made. Lisa Bradford req lab results from 07/07/12. Told Lisa Bradford all labs and pap smear-wnl. Lisa Bradford agrees.

## 2012-08-12 ENCOUNTER — Ambulatory Visit
Admission: RE | Admit: 2012-08-12 | Discharge: 2012-08-12 | Disposition: A | Discharge status: Home / Self Care | Payer: BC Managed Care – PPO | Source: Ambulatory Visit | Attending: Obstetrics and Gynecology | Admitting: Obstetrics and Gynecology

## 2012-08-12 ENCOUNTER — Other Ambulatory Visit: Payer: Self-pay | Admitting: Obstetrics and Gynecology

## 2012-08-12 DIAGNOSIS — Z1231 Encounter for screening mammogram for malignant neoplasm of breast: Secondary | ICD-10-CM

## 2012-08-12 DIAGNOSIS — N63 Unspecified lump in unspecified breast: Secondary | ICD-10-CM

## 2013-01-12 ENCOUNTER — Telehealth: Payer: Self-pay | Admitting: Internal Medicine

## 2013-01-12 NOTE — Telephone Encounter (Signed)
Patient Information:  Caller Name: Sarah  Phone: [phone]  Patient: Lisa Bradford, [place]  Gender: Female  DOB: 08/31/1971  Age: 42 Years  PCP: Berniece Andreas (Family Practice)  Pregnant: No  Office Follow Up:  Does the office need to follow up with this patient?: No  Instructions For The Office: N/A   Symptoms  Reason For Call & Symptoms: Calling that she has a sore throat and ear congestion, body aches and temp of 99.8.  No H/A.  Sore throat started 01/11/13 and achiness in neck and shoulders and this AM felt worse.  Reviewed Health History In EMR: Yes  Reviewed Medications In EMR: Yes  Reviewed Allergies In EMR: Yes  Reviewed Surgeries / Procedures: Yes  Date of Onset of Symptoms: 01/11/2013  Treatments Tried: Coriciden  Treatments Tried Worked: No OB / GYN:  LMP: Unknown  Guideline(s) Used:  Colds  Disposition Per Guideline:   Home Care  Reason For Disposition Reached:   Neti Pot, questions about  Advice Given:  Reassurance  It sounds like an uncomplicated cold that we can treat at home.  Colds are very common and may make you feel uncomfortable.  Colds are caused by viruses, and no medicine or "shot" will cure an uncomplicated cold.  Colds are usually not serious.  Here is some care advice that should help.  For a Runny Nose With Profuse Discharge:   Nasal mucus and discharge helps to wash viruses and bacteria out of the nose and sinuses.  Blowing the nose is all that is needed.  For a Stuffy Nose - Use Nasal Washes:  Introduction: Saline (salt water) nasal irrigation (nasal wash) is an effective and simple home remedy for treating stuffy nose and sinus congestion. The nose can be irrigated by pouring, spraying, or squirting salt water into the nose and then letting it run back out.  Methods: There are several ways to perform nasal irrigation. You can use a saline nasal spray bottle (available over-the-counter), a rubber ear syringe, a medical syringe without the  needle, or a Neti Pot.  Step-By-Step Instructions:   Step 1: Lean over a sink.  Step 2: Gently squirt or spray warm salt water into one of your nostrils.  Step 3: Some of the water may run into the back of your throat. Spit this out. If you swallow the salt water it will not hurt you.  Step 4: Blow your nose to clean out the water and mucus.  Step 5: Repeat steps 1-4 for the other nostril. You can do this a couple times a day if it seems to help you.  Treatment for Associated Symptoms of Colds:  For muscle aches, headaches, or moderate fever (more than 101 F or 38.9 C): Take acetaminophen every 4 hours.  Sore throat: Try throat lozenges, hard candy, or warm chicken broth.  Hydrate: Drink adequate liquids.  Contagiousness:  The cold virus is present in your nasal secretions.  Cover your nose and mouth with a tissue when you sneeze or cough.  Wash your hands frequently with soap and water.  You can return to work or school after the fever is gone and you feel well enough to participate in normal activities.  Patient Will Follow Care Advice:  YES

## 2013-01-13 ENCOUNTER — Encounter: Payer: Self-pay | Admitting: Family Medicine

## 2013-01-13 ENCOUNTER — Ambulatory Visit (INDEPENDENT_AMBULATORY_CARE_PROVIDER_SITE_OTHER): Payer: BC Managed Care – PPO | Admitting: Family Medicine

## 2013-01-13 VITALS — BP 94/62 | Temp 98.8°F | Wt 182.0 lb

## 2013-01-13 DIAGNOSIS — J020 Streptococcal pharyngitis: Secondary | ICD-10-CM

## 2013-01-13 MED ORDER — AMOXICILLIN 875 MG PO TABS: 875.0000 mg | ORAL_TABLET | Freq: Two times a day (BID) | ORAL | Status: DC

## 2013-01-13 NOTE — Progress Notes (Signed)
Chief Complaint  Patient presents with  . Sore Throat    bilateral ear pain, white patches on back of throat, low grade fever, sinus pain and pressurex 2days     HPI:  Acute visit for sore throat: -2 days ago -symptoms: ear pain fullness, nasal congestion, drainage in throat, sore throat, low grade fever, cough, sneezing, itchy nose, itchy eyes -denies: objective fever, NVD, tooth pain -history of allergies - takes zyrtec, used to take flonase - not taking ROS: See pertinent positives and negatives per HPI.  Past Medical History  Diagnosis Date  . Allergy   . Heart murmur     echo in 98  . Hypothyroidism     pos antibodies  goiter     Family History  Problem Relation Age of Onset  . Cancer Mother     ovarian and cervical  . Cancer Maternal Grandmother     lung  . Cancer Maternal Grandfather     lung and pancreatic  . Coronary artery disease Father     stent    History   Social History  . Marital Status: Single    Spouse Name: N/A    Number of Children: N/A  . Years of Education: N/A   Social History Main Topics  . Smoking status: Never Smoker   . Smokeless tobacco: Never Used  . Alcohol Use: Yes     Comment: once a month  . Drug Use: No  . Sexually Active: No   Other Topics Concern  . None   Social History Narrative   HH of 1   Pet cat.   ocass exercise .   No tob    rare etoh   caffiene  2 per day   Work  40 hours per week.    Good sleep.     Current outpatient prescriptions:fluticasone (FLONASE) 50 MCG/ACT nasal spray, INSTILL 2 SPRAYS IN EACH NOSTRIL EVERY DAY AS NEEDED FOR ALLERGIES AND SINUSITIS, Disp: 16 g, Rfl: 5;  levonorgestrel (MIRENA) 20 MCG/24HR IUD, 1 each by Intrauterine route once., Disp: , Rfl: ;  SYNTHROID 75 MCG tablet, TAKE 1 TABLET BY MOUTH ONCE A DAY, Disp: 30 tablet, Rfl: 11 amoxicillin (AMOXIL) 875 MG tablet, Take 1 tablet (875 mg total) by mouth 2 (two) times daily., Disp: 20 tablet, Rfl: 0;  FLUoxetine (PROZAC) 10 MG capsule,  Take 1 capsule (10 mg total) by mouth daily., Disp: 30 capsule, Rfl: 6  EXAM:  Filed Vitals:   01/13/13 1509  BP: 94/62  Temp: 98.8 F (37.1 C)    Body mass index is 31.22 kg/(m^2).  GENERAL: vitals reviewed and listed above, alert, oriented, appears well hydrated and in no acute distress  HEENT: atraumatic, conjunttiva clear, no obvious abnormalities on inspection of external nose and ears, normal appearance of ear canals and TMs, clear nasal congestion, mild post oropharyngeal erythema with PND, no 1+ tonsillar edema, no sinus TTP   NECK: no obvious masses on inspection  LUNGS: clear to auscultation bilaterally, no wheezes, rales or rhonchi, good air movement  CV: HRRR, no peripheral edema  MS: moves all extremities without noticeable abnormality  PSYCH: pleasant and cooperative, no obvious depression or anxiety  ASSESSMENT AND PLAN:  Discussed the following assessment and plan:  Streptococcal sore throat - Plan: POCT rapid strep A, amoxicillin (AMOXIL) 875 MG tablet  -pt worried about strep and this is going around - rapid strep + - tx with amoxicillin -also advised tx for her allergies -Patient advised to return  or notify a doctor immediately if symptoms worsen or persist or new concerns arise.  There are no Patient Instructions on file for this visit.   Colin Benton R.

## 2013-01-26 ENCOUNTER — Telehealth: Payer: Self-pay | Admitting: Internal Medicine

## 2013-01-26 ENCOUNTER — Telehealth: Payer: Self-pay | Admitting: Family Medicine

## 2013-01-26 MED ORDER — FLUTICASONE PROPIONATE 50 MCG/ACT NA SUSP: NASAL | Status: DC

## 2013-01-26 NOTE — Telephone Encounter (Signed)
Pt was advised to refill fluticasone (FLONASE) 50 MCG/ACT nasal spray, but it has been too long for pharm to fill. Can you send new RX to [facility] [place] [address]

## 2013-01-26 NOTE — Telephone Encounter (Signed)
30 day supply sent to [facility].  Message sent to the front desk to schedule the patient to be seen with WP.

## 2013-01-26 NOTE — Telephone Encounter (Signed)
I have not spoke to the pt.  She will need to come in and see WP.  Not seen in 1 year.  I refilled her Flonase for 30 days.  Please contact the pt and make this appt.  Thanks!!

## 2013-01-27 ENCOUNTER — Encounter: Payer: Self-pay | Admitting: Internal Medicine

## 2013-01-27 ENCOUNTER — Ambulatory Visit (INDEPENDENT_AMBULATORY_CARE_PROVIDER_SITE_OTHER): Payer: BC Managed Care – PPO | Admitting: Internal Medicine

## 2013-01-27 VITALS — BP 104/66 | HR 77 | Temp 98.1°F | Wt 181.0 lb

## 2013-01-27 DIAGNOSIS — J309 Allergic rhinitis, unspecified: Secondary | ICD-10-CM

## 2013-01-27 DIAGNOSIS — J039 Acute tonsillitis, unspecified: Secondary | ICD-10-CM

## 2013-01-27 DIAGNOSIS — Z8709 Personal history of other diseases of the respiratory system: Secondary | ICD-10-CM

## 2013-01-27 DIAGNOSIS — Z8619 Personal history of other infectious and parasitic diseases: Secondary | ICD-10-CM

## 2013-01-27 MED ORDER — AMOXICILLIN-POT CLAVULANATE 875-125 MG PO TABS: 1.0000 | ORAL_TABLET | Freq: Two times a day (BID) | ORAL | Status: DC

## 2013-01-27 MED ORDER — FLUTICASONE PROPIONATE 50 MCG/ACT NA SUSP: NASAL | Status: DC

## 2013-01-27 NOTE — Telephone Encounter (Signed)
Pt coming in

## 2013-01-27 NOTE — Progress Notes (Signed)
Chief Complaint  Patient presents with  . Sore Throat    Seen Dr. Selena Batten for cough on 01/13/13.  She was feeling better but now is taking a turn for the worse.  Finished her antibiotic on Friday.  . Generalized Body Aches    Taking Advil  . Headache  . Cough    HPI: Patient comes in today for SDA for problem evaluation. SAW DR kIM 4 22 AND HAD pos rapid strep and sore throat with ? Exudate/ took all med amox 875 bid for 10 days . Clinical improvement in a few days and better at end of med  . Off med about 3 days and began to have a sore throat again yesterday and now worse today sore achy and neck hurting no chills  Low grade temp 99 . No rash no  New exposures.  No hx of  Recurrent tonsillitis .   ROS: See pertinent positives and negatives per HPI. Needs refill flonase  For allergy helps Past Medical History  Diagnosis Date  . Allergy   . Heart murmur     echo in 98  . Hypothyroidism     pos antibodies  goiter     Family History  Problem Relation Age of Onset  . Cancer Mother     ovarian and cervical  . Cancer Maternal Grandmother     lung  . Cancer Maternal Grandfather     lung and pancreatic  . Coronary artery disease Father     stent    History   Social History  . Marital Status: Single    Spouse Name: N/A    Number of Children: N/A  . Years of Education: N/A   Social History Main Topics  . Smoking status: Never Smoker   . Smokeless tobacco: Never Used  . Alcohol Use: Yes     Comment: once a month  . Drug Use: No  . Sexually Active: No   Other Topics Concern  . None   Social History Narrative   HH of 1   Pet cat.   ocass exercise .   No tob    rare etoh   caffiene  2 per day   Work  40 hours per week.    Good sleep.     Outpatient Encounter Prescriptions as of 01/27/2013  Medication Sig Dispense Refill  . fluticasone (FLONASE) 50 MCG/ACT nasal spray INSTILL 2 SPRAYS IN EACH NOSTRIL EVERY DAY AS NEEDED FOR ALLERGIES AND SINUSITIS  16 g  11  .  levonorgestrel (MIRENA) 20 MCG/24HR IUD 1 each by Intrauterine route once.      Marland Kitchen SYNTHROID 75 MCG tablet TAKE 1 TABLET BY MOUTH ONCE A DAY  30 tablet  11  . [DISCONTINUED] fluticasone (FLONASE) 50 MCG/ACT nasal spray INSTILL 2 SPRAYS IN EACH NOSTRIL EVERY DAY AS NEEDED FOR ALLERGIES AND SINUSITIS  16 g  0  . amoxicillin-clavulanate (AUGMENTIN) 875-125 MG per tablet Take 1 tablet by mouth every 12 (twelve) hours.  20 tablet  0  . [DISCONTINUED] amoxicillin (AMOXIL) 875 MG tablet Take 1 tablet (875 mg total) by mouth 2 (two) times daily.  20 tablet  0  . [DISCONTINUED] FLUoxetine (PROZAC) 10 MG capsule Take 1 capsule (10 mg total) by mouth daily.  30 capsule  6   No facility-administered encounter medications on file as of 01/27/2013.    EXAM:  BP 104/66  Pulse 77  Temp(Src) 98.1 F (36.7 C) (Oral)  Wt 181 lb (82.101 kg)  BMI 31.05 kg/m2  SpO2 97%  Body mass index is 31.05 kg/(m^2).  GENERAL: vitals reviewed and listed above, alert, oriented, appears well hydrated and in no acute distress  HEENT: atraumatic, conjunctiva  clear, no obvious abnormalities on inspection of external nose and ears  tms clear OP :  red1-2+  Tonsil 2 + exudate on left  ?ild uvular edema  Good airway   NECK: no obvious masses on inspection palpation  Supple  1 + ac nodes tender neg pc   LUNGS: clear to auscultation bilaterally, no wheezes, rales or rhonchi, good air movement CV: HRRR, no clubbing cyanosis or  peripheral edema nl cap refill   MS: moves all extremities without noticeable focal  abnormality Skin: normal capillary refill ,turgor , color: No acute rashes ,petechiae or bruising   ASSESSMENT AND PLAN:  Discussed the following assessment and plan:  Acute tonsillitis - preseumed grp a strep from last check relapsing augmentin and close follow up .  Hx of streptococcal pharyngitis - recent treatment cw relapse after full course of amox.   RHINITIS Refill flonase today and reviewed  -Patient  advised to return or notify health care team  if symptoms worsen or persist or new concerns arise.  Patient Instructions  You have tonsillitis  Presumed from grp a strep. Considering this a relapsing infection. Begin augmentin which is  an extended spectrum penicillin to kill  more  Bacterial in the throat  that could be adding to the infection.  Expect significant improvement  In the next 48 - 72 hours  If not then contact us for reevaluation.  If you are not totally better at the end of the antibiotic   Plan  Ov to have [place] check your tonsils.      Neta Mends. Deangela Randleman M.D. Encouraged to sign up for my chart

## 2013-01-27 NOTE — Patient Instructions (Addendum)
You have tonsillitis  Presumed from grp a strep. Considering this a relapsing infection. Begin augmentin which is  an extended spectrum penicillin to kill  more  Bacterial in the throat  that could be adding to the infection.  Expect significant improvement  In the next 48 - 72 hours  If not then contact us for reevaluation.  If you are not totally better at the end of the antibiotic   Plan  Ov to have [place] check your tonsils.

## 2013-02-11 ENCOUNTER — Encounter: Payer: Self-pay | Admitting: Internal Medicine

## 2013-02-11 ENCOUNTER — Ambulatory Visit (INDEPENDENT_AMBULATORY_CARE_PROVIDER_SITE_OTHER): Payer: BC Managed Care – PPO | Admitting: Internal Medicine

## 2013-02-11 VITALS — BP 100/64 | HR 65 | Temp 98.3°F | Wt 178.0 lb

## 2013-02-11 DIAGNOSIS — J039 Acute tonsillitis, unspecified: Secondary | ICD-10-CM

## 2013-02-11 DIAGNOSIS — J0391 Acute recurrent tonsillitis, unspecified: Secondary | ICD-10-CM

## 2013-02-11 LAB — CBC WITH DIFFERENTIAL/PLATELET
Basophils Absolute: 0.1 10*3/uL (ref 0.0–0.1)
Eosinophils Absolute: 0.2 10*3/uL (ref 0.0–0.7)
Hemoglobin: 13.1 g/dL (ref 12.0–15.0)
Lymphocytes Relative: 17.1 % (ref 12.0–46.0)
MCHC: 34.6 g/dL (ref 30.0–36.0)
Neutro Abs: 10.5 10*3/uL — ABNORMAL HIGH (ref 1.4–7.7)
RDW: 12.4 % (ref 11.5–14.6)

## 2013-02-11 MED ORDER — AMOXICILLIN-POT CLAVULANATE 875-125 MG PO TABS: 1.0000 | ORAL_TABLET | Freq: Two times a day (BID) | ORAL | Status: DC

## 2013-02-11 NOTE — Progress Notes (Signed)
Chief Complaint  Patient presents with  . Sore Throat  . Fatigue  . Generalized Body Aches    HPI: Patient comes in today for SDA for  new problem evaluation. Has been under rx for tonsillitis   With 2 courses of antibiotics.   Last treatment with Augmentin she improved dramatically and in 2-3 days finish out the medicine last week in 4-5 days later now has had one day of sore throat again not as bad as before minor cough no news symptoms and no fever. No unusual rashes. She tolerated the last antibiotic well.   No past history of mono. ROS: See pertinent positives and negatives per HPI. No chest pain shortness of breath syncope.  Past Medical History  Diagnosis Date  . Allergy   . Heart murmur     echo in 98  . Hypothyroidism     pos antibodies  goiter     Family History  Problem Relation Age of Onset  . Cancer Mother     ovarian and cervical  . Cancer Maternal Grandmother     lung  . Cancer Maternal Grandfather     lung and pancreatic  . Coronary artery disease Father     stent    History   Social History  . Marital Status: Single    Spouse Name: N/A    Number of Children: N/A  . Years of Education: N/A   Social History Main Topics  . Smoking status: Never Smoker   . Smokeless tobacco: Never Used  . Alcohol Use: Yes     Comment: once a month  . Drug Use: No  . Sexually Active: No   Other Topics Concern  . None   Social History Narrative   HH of 1   Pet cat.   ocass exercise .   No tob    rare etoh   caffiene  2 per day   Work  40 hours per week.    Good sleep.     Outpatient Encounter Prescriptions as of 02/11/2013  Medication Sig Dispense Refill  . fluticasone (FLONASE) 50 MCG/ACT nasal spray INSTILL 2 SPRAYS IN EACH NOSTRIL EVERY DAY AS NEEDED FOR ALLERGIES AND SINUSITIS  16 g  11  . levonorgestrel (MIRENA) 20 MCG/24HR IUD 1 each by Intrauterine route once.      Marland Kitchen SYNTHROID 75 MCG tablet TAKE 1 TABLET BY MOUTH ONCE A DAY  30 tablet  11  .  amoxicillin-clavulanate (AUGMENTIN) 875-125 MG per tablet Take 1 tablet by mouth every 12 (twelve) hours.  42 tablet  0  . [DISCONTINUED] amoxicillin-clavulanate (AUGMENTIN) 875-125 MG per tablet Take 1 tablet by mouth every 12 (twelve) hours.  20 tablet  0   No facility-administered encounter medications on file as of 02/11/2013.    EXAM:  BP 100/64  Pulse 65  Temp(Src) 98.3 F (36.8 C) (Oral)  Wt 178 lb (80.74 kg)  BMI 30.54 kg/m2  SpO2 98%  Body mass index is 30.54 kg/(m^2).  GENERAL: vitals reviewed and listed above, alert, oriented, appears well hydrated and in no acute distress  HEENT: atraumatic, conjunctiva  clear, no obvious abnormalities on inspection of external nose and ears TMs are clear nose is patent OP : Tonsils +1-2 there is still a white exudate on the left tonsil but no significant edema and airway is good NECK: no obvious masses on inspection ; palpation mild anterior palpable lymph nodes and no obvious PC  LUNGS: clear to auscultation bilaterally, no wheezes, rales  or rhonchi, good air movement Abdomen soft without organomegaly guarding or rebound CV: HRRR, no clubbing cyanosis or  peripheral edema nl cap refill  Skin no unusual rashes MS: moves all extremities without noticeable focal  abnormality  PSYCH: pleasant and cooperative, no obvious depression or anxiety  ASSESSMENT AND PLAN:  Discussed the following assessment and plan:  Recurrent acute tonsillitis - Plan: CBC with Differential, Epstein-Barr virus VCA antibody panel, Culture, Group A Strep Plan throat culture today CBC mono restart antibiotic consider longer treatment and or referral to ear nose and throat  She apparently responds dramatically to the antibiotic but the symptoms come back.  We'll plan on checking her in 3 weeks before she runs out of the antibiotic. Threshold to do referral but she gets a much better in between that at this time we'll just give a prolonged course of  medication -Patient advised to return or notify health care team  if symptoms worsen or persist or new concerns arise.  Patient Instructions  Your throat has some white spots on the tonsils again but certainly not as bad as before.  We'll do throat culture today a blood test.  Plan three-week antibiotic course and if not dramatically improved her relapse again or other concerns we will get  ent  consult      Burna Mortimer K. Kaiyden Simkin M.D.

## 2013-02-11 NOTE — Patient Instructions (Signed)
Your throat has some white spots on the tonsils again but certainly not as bad as before.  We'll do throat culture today a blood test.  Plan three-week antibiotic course and if not dramatically improved her relapse again or other concerns we will get  ent  consult

## 2013-02-12 LAB — EPSTEIN-BARR VIRUS VCA ANTIBODY PANEL: EBV EA IgG: 114 U/mL — ABNORMAL HIGH (ref <9.0)

## 2013-02-13 NOTE — Progress Notes (Signed)
Quick Note:  Called and spoke with pt and pt is aware. ______ 

## 2013-03-04 ENCOUNTER — Ambulatory Visit (INDEPENDENT_AMBULATORY_CARE_PROVIDER_SITE_OTHER): Payer: BC Managed Care – PPO | Admitting: Internal Medicine

## 2013-03-04 ENCOUNTER — Encounter: Payer: Self-pay | Admitting: Internal Medicine

## 2013-03-04 VITALS — BP 110/66 | HR 72 | Temp 98.3°F | Wt 177.0 lb

## 2013-03-04 DIAGNOSIS — J020 Streptococcal pharyngitis: Secondary | ICD-10-CM

## 2013-03-04 DIAGNOSIS — J0301 Acute recurrent streptococcal tonsillitis: Secondary | ICD-10-CM

## 2013-03-04 NOTE — Patient Instructions (Signed)
Finish the med   Contact us if recurring sx and will make a plan.  aabout follow up.

## 2013-03-04 NOTE — Progress Notes (Signed)
Chief Complaint  Patient presents with  . Follow-up    HPI: Her for fu of recurrent tonsillitis. Better  After a few days.  On Augmentin  Completing 3 week course of med no se . thinks throat is ok ? No fever adenopathy.  Last cx pos for GPABSTREP.     ROS: See pertinent positives and negatives per HPI.  Past Medical History  Diagnosis Date  . Allergy   . Heart murmur     echo in 98  . Hypothyroidism     pos antibodies  goiter     Family History  Problem Relation Age of Onset  . Cancer Mother     ovarian and cervical  . Cancer Maternal Grandmother     lung  . Cancer Maternal Grandfather     lung and pancreatic  . Coronary artery disease Father     stent    History   Social History  . Marital Status: Single    Spouse Name: N/A    Number of Children: N/A  . Years of Education: N/A   Social History Main Topics  . Smoking status: Never Smoker   . Smokeless tobacco: Never Used  . Alcohol Use: Yes     Comment: once a month  . Drug Use: No  . Sexually Active: No   Other Topics Concern  . None   Social History Narrative   HH of 1   Pet cat.   ocass exercise .   No tob    rare etoh   caffiene  2 per day   Work  40 hours per week.    Good sleep.     Outpatient Encounter Prescriptions as of 03/04/2013  Medication Sig Dispense Refill  . amoxicillin-clavulanate (AUGMENTIN) 875-125 MG per tablet Take 1 tablet by mouth every 12 (twelve) hours.  42 tablet  0  . fluticasone (FLONASE) 50 MCG/ACT nasal spray INSTILL 2 SPRAYS IN EACH NOSTRIL EVERY DAY AS NEEDED FOR ALLERGIES AND SINUSITIS  16 g  11  . levonorgestrel (MIRENA) 20 MCG/24HR IUD 1 each by Intrauterine route once.      Marland Kitchen SYNTHROID 75 MCG tablet TAKE 1 TABLET BY MOUTH ONCE A DAY  30 tablet  11   No facility-administered encounter medications on file as of 03/04/2013.    EXAM:  BP 110/66  Pulse 72  Temp(Src) 98.3 F (36.8 C) (Oral)  Wt 177 lb (80.287 kg)  BMI 30.37 kg/m2  SpO2 98%  Body mass index  is 30.37 kg/(m^2).  GENERAL: vitals reviewed and listed above, alert, oriented, appears well hydrated and in no acute distress  HEENT: atraumatic, conjunctiva  clear, no obvious abnormalities on inspection of external nose and ears OP : no lesion edema or exudate  Tonsil left 0 right 1 no edema exudate  milkd prominence   NECK: no obvious masses on inspection palpation  No adenopathy   PSYCH: pleasant and cooperative, no obvious depression or anxiety  ASSESSMENT AND PLAN:  Discussed the following assessment and plan:  Recurrent streptococcal tonsillitis - improved  3rd round 3 weeks antibiotic augmentin  if relaps consider clind or ereferral Call right away   Message to me   If recurring sx .   -Patient advised to return or notify health care team  if symptoms worsen or persist or new concerns arise.  Patient Instructions  Doreatha Martin the med   Contact us if recurring sx and will make a plan.  aabout follow up.    Burna Mortimer  Lonie Peak M.D. Ho on my chart

## 2013-03-13 ENCOUNTER — Telehealth: Payer: Self-pay | Admitting: Internal Medicine

## 2013-03-13 ENCOUNTER — Other Ambulatory Visit: Payer: Self-pay | Admitting: Internal Medicine

## 2013-03-13 ENCOUNTER — Encounter: Payer: Self-pay | Admitting: Internal Medicine

## 2013-03-13 ENCOUNTER — Ambulatory Visit (INDEPENDENT_AMBULATORY_CARE_PROVIDER_SITE_OTHER): Payer: BC Managed Care – PPO | Admitting: Internal Medicine

## 2013-03-13 VITALS — BP 100/62 | HR 80 | Temp 98.4°F | Wt 176.0 lb

## 2013-03-13 DIAGNOSIS — J0391 Acute recurrent tonsillitis, unspecified: Secondary | ICD-10-CM | POA: Insufficient documentation

## 2013-03-13 DIAGNOSIS — J039 Acute tonsillitis, unspecified: Secondary | ICD-10-CM

## 2013-03-13 MED ORDER — CEPHALEXIN 500 MG PO TABS: 500.0000 mg | ORAL_TABLET | Freq: Three times a day (TID) | ORAL | Status: DC

## 2013-03-13 NOTE — Telephone Encounter (Signed)
Patient coming in at 2:15 per [facility].

## 2013-03-13 NOTE — Telephone Encounter (Signed)
I spoke to the pt.  Sore throat, body aches and chills started again last night.  She is not sure if she has a fever but believes she does.  Would like to know if she needs another antibiotic or to be seen in the clinic.  She is going out of town on Monday.  Thanks!!

## 2013-03-13 NOTE — Progress Notes (Signed)
Chief Complaint  Patient presents with  . Sore Throat    Also has pressure in her ears.  . Fatigue  . Generalized Body Aches    HPI: Patient comes in today for SDA for  Recurrent  problem evaluation. Finished last 3 week course of antibiotic  June 11 and had been well over a week until last 24 hours with st malaise and tender glands again. Going out of town. This weekend  To [place] a week.  No other exposures   ROS: See pertinent positives and negatives per HPI.  Past Medical History  Diagnosis Date  . Allergy   . Heart murmur     echo in 98  . Hypothyroidism     pos antibodies  goiter     Family History  Problem Relation Age of Onset  . Cancer Mother     ovarian and cervical  . Cancer Maternal Grandmother     lung  . Cancer Maternal Grandfather     lung and pancreatic  . Coronary artery disease Father     stent    History   Social History  . Marital Status: Single    Spouse Name: N/A    Number of Children: N/A  . Years of Education: N/A   Social History Main Topics  . Smoking status: Never Smoker   . Smokeless tobacco: Never Used  . Alcohol Use: Yes     Comment: once a month  . Drug Use: No  . Sexually Active: No   Other Topics Concern  . None   Social History Narrative   HH of 1   Pet cat.   ocass exercise .   No tob    rare etoh   caffiene  2 per day   Work  40 hours per week.    Good sleep.     Outpatient Encounter Prescriptions as of 03/13/2013  Medication Sig Dispense Refill  . fluticasone (FLONASE) 50 MCG/ACT nasal spray INSTILL 2 SPRAYS IN EACH NOSTRIL EVERY DAY AS NEEDED FOR ALLERGIES AND SINUSITIS  16 g  11  . levonorgestrel (MIRENA) 20 MCG/24HR IUD 1 each by Intrauterine route once.      Marland Kitchen SYNTHROID 75 MCG tablet TAKE 1 TABLET BY MOUTH ONCE A DAY  30 tablet  3  . Cephalexin 500 MG tablet Take 1 tablet (500 mg total) by mouth 3 (three) times daily.  30 tablet  1  . [DISCONTINUED] amoxicillin-clavulanate (AUGMENTIN) 875-125 MG per  tablet Take 1 tablet by mouth every 12 (twelve) hours.  42 tablet  0   No facility-administered encounter medications on file as of 03/13/2013.    EXAM:  BP 100/62  Pulse 80  Temp(Src) 98.4 F (36.9 C) (Oral)  Wt 176 lb (79.833 kg)  BMI 30.2 kg/m2  SpO2 98%  Body mass index is 30.2 kg/(m^2).  GENERAL: vitals reviewed and listed above, alert, oriented, appears well hydrated and in no acute distress  HEENT: atraumatic, conjunctiva  clear, no obvious abnormalities on inspection of external nose and ears OP :  Tonsil 2 + left early exudate    NECK: no obvious masses on inspection palpation  Tender ac nodes 1 +  neg pc nodes   PSYCH: pleasant and cooperative, no obvious depression or anxiety  ASSESSMENT AND PLAN:  Discussed the following assessment and plan:  Recurrent acute tonsillitis - Plan: Culture, Group A Strep Sp rxc amox and augmentin  Responds very quickly to antibiotics but recurs.  Change to  keflex and get ent   Consult  -Patient advised to return or notify health care team  if symptoms worsen or persist or new concerns arise.  Patient Instructions  Restart antibiotic keflex today   We will arrange an ENT consult with notes given.        Neta Mends. Panosh M.D.

## 2013-03-13 NOTE — Telephone Encounter (Signed)
Pt states she has been treated for strep infection since 01/13/13.  Pt states she completed the last antibiotic on 03/04/13 and then on 03/12/13 her symptoms returned with sore throat and body aches.  RN viewed in EPIC the last office visit (03/04/13) and Dr Fabian Sharp said to message her if symptoms return.  Patient wants to know if she needs to be seen again or if additional antibiotics need to be called in for her.  Pt is going out of town on Monday, 03/16/13.  OFFICE, please follow up with patient.

## 2013-03-13 NOTE — Patient Instructions (Addendum)
Restart antibiotic keflex today   We will arrange an ENT consult with notes given.

## 2013-03-13 NOTE — Telephone Encounter (Signed)
See if she can get in here to check   Today . i will owrk her in when she can come .  prob needs re treatment .

## 2013-03-23 ENCOUNTER — Encounter: Payer: Self-pay | Admitting: Internal Medicine

## 2013-06-28 ENCOUNTER — Ambulatory Visit: Payer: BC Managed Care – PPO

## 2013-06-28 ENCOUNTER — Ambulatory Visit (INDEPENDENT_AMBULATORY_CARE_PROVIDER_SITE_OTHER): Payer: BC Managed Care – PPO | Admitting: Internal Medicine

## 2013-06-28 VITALS — BP 114/70 | HR 73 | Temp 98.1°F | Resp 16 | Ht 64.75 in | Wt 178.8 lb

## 2013-06-28 DIAGNOSIS — R05 Cough: Secondary | ICD-10-CM

## 2013-06-28 DIAGNOSIS — J209 Acute bronchitis, unspecified: Secondary | ICD-10-CM

## 2013-06-28 LAB — POCT CBC
Lymph, poc: 2.6 (ref 0.6–3.4)
MCH, POC: 29.5 pg (ref 27–31.2)
MCHC: 31.7 g/dL — AB (ref 31.8–35.4)
MID (cbc): 0.7 (ref 0–0.9)
MPV: 10.2 fL (ref 0–99.8)
POC LYMPH PERCENT: 31.1 %L (ref 10–50)
POC MID %: 7.8 %M (ref 0–12)
Platelet Count, POC: 217 10*3/uL (ref 142–424)
RBC: 4.44 M/uL (ref 4.04–5.48)
RDW, POC: 13.4 %
WBC: 8.5 10*3/uL (ref 4.6–10.2)

## 2013-06-28 MED ORDER — AZITHROMYCIN 500 MG PO TABS: 500.0000 mg | ORAL_TABLET | Freq: Every day | ORAL | Status: DC

## 2013-06-28 MED ORDER — HYDROCODONE-ACETAMINOPHEN 7.5-325 MG/15ML PO SOLN: 5.0000 mL | Freq: Four times a day (QID) | ORAL | Status: DC | PRN

## 2013-06-28 NOTE — Patient Instructions (Signed)

## 2013-06-28 NOTE — Progress Notes (Signed)
  Subjective:    Patient ID: Lisa Bradford, female    DOB: 02-11-1971, 42 y.o.   MRN: [id number]  HPI  42 YO female patient comes in today with complaints of cold symptoms, congested, chills and ST. Cough started on Thursday. The cough is productive with a yellow, thick sputum. She has not been able to rest peacefully at night due to the cough. She has only found comfort when sleeping upright. She has tried Mucinex. Her chest hurts, no hemoptysis.  Patient had a constant series of Strep Throat from April to July of this year. She was unable to resolve. Her ENT advised her she may need a tonsillectomy if she gets another case of Strep Throat.   Denies a fever, body aches. Her coworkers have been ill recently.   Review of Systems     Objective:   Physical Exam  Constitutional: She is oriented to person, place, and time.  HENT:  Head: Normocephalic.  Right Ear: Hearing, tympanic membrane, external ear and ear canal normal.  Left Ear: Hearing and ear canal normal. There is tenderness. No drainage or swelling. Tympanic membrane is injected and erythematous. Tympanic membrane is not retracted and not bulging.  No middle ear effusion. No decreased hearing is noted.  Nose: Mucosal edema and rhinorrhea present.  Mouth/Throat: Oropharynx is clear and moist.  Eyes: Conjunctivae and EOM are normal. Pupils are equal, round, and reactive to light.  Neck: Normal range of motion. Neck supple.  Cardiovascular: Normal rate.   Pulmonary/Chest: Not tachypneic. She has no decreased breath sounds. She has wheezes. She has rhonchi. She has no rales. She exhibits tenderness.  Musculoskeletal: Normal range of motion.  Neurological: She is alert and oriented to person, place, and time. No cranial nerve deficit. She exhibits normal muscle tone. Coordination normal.  Skin: No rash noted.  Psychiatric: She has a normal mood and affect. Her behavior is normal.   [facility] reading (PRIMARY) by  Dr Perrin Maltese  No  infiltrate  Results for orders placed in visit on 06/28/13  POCT CBC      Result Value Range   WBC 8.5  4.6 - 10.2 K/uL   Lymph, poc 2.6  0.6 - 3.4   POC LYMPH PERCENT 31.1  10 - 50 %L   MID (cbc) 0.7  0 - 0.9   POC MID % 7.8  0 - 12 %M   POC Granulocyte 5.2  2 - 6.9   Granulocyte percent 61.1  37 - 80 %G   RBC 4.44  4.04 - 5.48 M/uL   Hemoglobin 13.1  12.2 - 16.2 g/dL   HCT, POC 86.5  78.4 - 47.9 %   MCV 93.0  80 - 97 fL   MCH, POC 29.5  27 - 31.2 pg   MCHC 31.7 (*) 31.8 - 35.4 g/dL   RDW, POC 69.6     Platelet Count, POC 217  142 - 424 K/uL   MPV 10.2  0 - 99.8 fL          Assessment & Plan:  Bronchitis/Zithromax 500mg Leandro Reasoner elixir

## 2013-06-29 ENCOUNTER — Telehealth: Payer: Self-pay

## 2013-06-29 NOTE — Telephone Encounter (Signed)
Pt is calling states she was seen by dr guest yesterday and was prescribed an antibiotic and cough meds, wants to know if she can take mucinex as well

## 2013-06-29 NOTE — Telephone Encounter (Signed)
Spoke with pt, advised it was ok to take Mucinex.

## 2013-06-30 ENCOUNTER — Telehealth: Payer: Self-pay | Admitting: Internal Medicine

## 2013-06-30 NOTE — Telephone Encounter (Signed)
Patient notified of all.  She will complete her antibiotic given by Dr. Perrin Maltese.  She will wait to make appointment and has declined a different cough medication.  Instructed to call back and make appt if not better or worse after she completes the antibiotic or has worsening sob.

## 2013-06-30 NOTE — Telephone Encounter (Signed)
This could be a viral infection and thus antibiotics dont make it better quicker  An even so not enough time to tell if antibiotic is helping.  Can see her Friday or next week if concerned.  Bronchitis cough can last  2-3 weeks.   Cough meds don't work well   But can try adding tessalon perles  100 mg 1 po tid prn cough disp 30 refill x 1 .

## 2013-06-30 NOTE — Telephone Encounter (Signed)
Spoke to the pt.  She seen Dr. Perrin Maltese on Sunday.  Was given an antibiotic and cough syrup.  Chest x-ray was normal.  She said she is not any better.  Thought she may need to come in.  Explained that the antibiotic has not had a chance to really affect her system.  Her sx are no worse or no better.  She has little sob when she is coughing and after.  Could we give an inhaler? Prednisone?  No fever.  Cough medicine is also not helping. Could we switch to something different?

## 2013-06-30 NOTE — Telephone Encounter (Signed)
Pt went to [facility] on Sunday. Dx w/ bronchitis. Pt not any better. Would like to come in. Only a 15 min at 3:30. Is it ok to use?

## 2013-07-30 ENCOUNTER — Other Ambulatory Visit: Payer: Self-pay

## 2013-09-01 ENCOUNTER — Other Ambulatory Visit: Payer: Self-pay | Admitting: Internal Medicine

## 2013-09-02 NOTE — Telephone Encounter (Signed)
Tell patient she is due for yearly visit for her thyroid check  Plan tsh and ov with results . Can refill enough until   Lab and ov done

## 2013-09-02 NOTE — Telephone Encounter (Signed)
No TSH since 07/07/2012.  Please review and advise.  Thanks!

## 2013-09-03 ENCOUNTER — Telehealth: Payer: Self-pay | Admitting: Family Medicine

## 2013-09-03 DIAGNOSIS — E039 Hypothyroidism, unspecified: Secondary | ICD-10-CM

## 2013-09-03 NOTE — Telephone Encounter (Signed)
lmom for pt to call back

## 2013-09-03 NOTE — Telephone Encounter (Signed)
This patient needs lab work and a wellness exam per [facility]. Please contact the pt and make both of these appointments.  I will place the order for her lab work.  Thanks!

## 2013-09-04 NOTE — Telephone Encounter (Signed)
lmom for pt to call back

## 2013-09-08 ENCOUNTER — Other Ambulatory Visit: Payer: Self-pay | Admitting: Family Medicine

## 2013-09-08 DIAGNOSIS — Z0000 Encounter for general adult medical examination without abnormal findings: Secondary | ICD-10-CM

## 2013-09-08 NOTE — Telephone Encounter (Signed)
Pt scheduled for wellness exam, is the tsh all you want?

## 2013-09-08 NOTE — Telephone Encounter (Signed)
lmom for pt to call back

## 2013-09-08 NOTE — Telephone Encounter (Signed)
I placed more orders.  Not sure what happened.  Thanks!

## 2013-09-08 NOTE — Telephone Encounter (Signed)
sched pt for cpx labs

## 2013-09-09 NOTE — Telephone Encounter (Signed)
Lm on cell for pt to callback

## 2013-10-09 ENCOUNTER — Other Ambulatory Visit: Payer: Self-pay

## 2013-10-09 DIAGNOSIS — Z1231 Encounter for screening mammogram for malignant neoplasm of breast: Secondary | ICD-10-CM

## 2013-10-29 ENCOUNTER — Ambulatory Visit
Admission: RE | Admit: 2013-10-29 | Discharge: 2013-10-29 | Disposition: A | Discharge status: Home / Self Care | Payer: BC Managed Care – PPO | Source: Ambulatory Visit

## 2013-10-29 DIAGNOSIS — Z1231 Encounter for screening mammogram for malignant neoplasm of breast: Secondary | ICD-10-CM

## 2013-11-03 ENCOUNTER — Other Ambulatory Visit: Payer: Self-pay | Admitting: Obstetrics and Gynecology

## 2013-11-03 ENCOUNTER — Other Ambulatory Visit (INDEPENDENT_AMBULATORY_CARE_PROVIDER_SITE_OTHER): Payer: BC Managed Care – PPO

## 2013-11-03 DIAGNOSIS — E039 Hypothyroidism, unspecified: Secondary | ICD-10-CM

## 2013-11-03 DIAGNOSIS — Z0000 Encounter for general adult medical examination without abnormal findings: Secondary | ICD-10-CM

## 2013-11-03 DIAGNOSIS — R928 Other abnormal and inconclusive findings on diagnostic imaging of breast: Secondary | ICD-10-CM

## 2013-11-03 LAB — CBC WITH DIFFERENTIAL/PLATELET
BASOS ABS: 0 10*3/uL (ref 0.0–0.1)
BASOS PCT: 0.2 % (ref 0.0–3.0)
EOS ABS: 0.2 10*3/uL (ref 0.0–0.7)
Eosinophils Relative: 2.6 % (ref 0.0–5.0)
HCT: 38.5 % (ref 36.0–46.0)
Hemoglobin: 12.9 g/dL (ref 12.0–15.0)
Lymphocytes Relative: 33.2 % (ref 12.0–46.0)
Lymphs Abs: 2.5 10*3/uL (ref 0.7–4.0)
MCHC: 33.5 g/dL (ref 30.0–36.0)
MCV: 89.2 fl (ref 78.0–100.0)
MONO ABS: 0.4 10*3/uL (ref 0.1–1.0)
Monocytes Relative: 5 % (ref 3.0–12.0)
NEUTROS PCT: 59 % (ref 43.0–77.0)
Neutro Abs: 4.5 10*3/uL (ref 1.4–7.7)
PLATELETS: 198 10*3/uL (ref 150.0–400.0)
RBC: 4.31 Mil/uL (ref 3.87–5.11)
RDW: 12.6 % (ref 11.5–14.6)
WBC: 7.6 10*3/uL (ref 4.5–10.5)

## 2013-11-03 LAB — BASIC METABOLIC PANEL
BUN: 15 mg/dL (ref 6–23)
CHLORIDE: 106 meq/L (ref 96–112)
CO2: 29 meq/L (ref 19–32)
Calcium: 9.5 mg/dL (ref 8.4–10.5)
Creatinine, Ser: 1.1 mg/dL (ref 0.4–1.2)
GFR: 60.62 mL/min (ref >60.00)
Glucose, Bld: 81 mg/dL (ref 70–99)
POTASSIUM: 4.6 meq/L (ref 3.5–5.1)
Sodium: 142 mEq/L (ref 135–145)

## 2013-11-03 LAB — HEPATIC FUNCTION PANEL
ALT: 22 U/L (ref 0–35)
AST: 26 U/L (ref 0–37)
Albumin: 4 g/dL (ref 3.5–5.2)
Alkaline Phosphatase: 72 U/L (ref 39–117)
BILIRUBIN DIRECT: 0 mg/dL (ref 0.0–0.3)
TOTAL PROTEIN: 8 g/dL (ref 6.0–8.3)
Total Bilirubin: 0.5 mg/dL (ref 0.3–1.2)

## 2013-11-03 LAB — TSH: TSH: 63.65 u[IU]/mL — AB (ref 0.35–5.50)

## 2013-11-03 LAB — LIPID PANEL
CHOLESTEROL: 198 mg/dL (ref 0–200)
HDL: 35.9 mg/dL — AB (ref >39.00)
LDL CALC: 132 mg/dL — AB (ref 0–99)
TRIGLYCERIDES: 151 mg/dL — AB (ref 0.0–149.0)
Total CHOL/HDL Ratio: 6
VLDL: 30.2 mg/dL (ref 0.0–40.0)

## 2013-11-04 LAB — VITAMIN D 25 HYDROXY (VIT D DEFICIENCY, FRACTURES): VIT D 25 HYDROXY: 46 ng/mL (ref 30–89)

## 2013-11-05 ENCOUNTER — Encounter: Payer: Self-pay | Admitting: Internal Medicine

## 2013-11-06 ENCOUNTER — Other Ambulatory Visit: Payer: Self-pay | Admitting: Family Medicine

## 2013-11-06 DIAGNOSIS — E039 Hypothyroidism, unspecified: Secondary | ICD-10-CM

## 2013-11-06 DIAGNOSIS — R7989 Other specified abnormal findings of blood chemistry: Secondary | ICD-10-CM

## 2013-11-06 NOTE — Telephone Encounter (Signed)
Ask her is she is taking med the same . I agree that this is unusual. Please repeat tsh with free T4  Monday or at visit on tuesday

## 2013-11-09 ENCOUNTER — Other Ambulatory Visit (INDEPENDENT_AMBULATORY_CARE_PROVIDER_SITE_OTHER): Payer: BC Managed Care – PPO

## 2013-11-09 DIAGNOSIS — R7989 Other specified abnormal findings of blood chemistry: Secondary | ICD-10-CM

## 2013-11-09 DIAGNOSIS — R946 Abnormal results of thyroid function studies: Secondary | ICD-10-CM

## 2013-11-09 DIAGNOSIS — E039 Hypothyroidism, unspecified: Secondary | ICD-10-CM

## 2013-11-09 LAB — T4, FREE: Free T4: 0.61 ng/dL (ref 0.60–1.60)

## 2013-11-09 LAB — TSH: TSH: 73.17 u[IU]/mL — AB (ref 0.35–5.50)

## 2013-11-10 ENCOUNTER — Encounter: Payer: BC Managed Care – PPO | Admitting: Internal Medicine

## 2013-11-12 ENCOUNTER — Other Ambulatory Visit: Payer: Self-pay | Admitting: Family Medicine

## 2013-11-12 MED ORDER — SYNTHROID 100 MCG PO TABS: 100.0000 ug | ORAL_TABLET | Freq: Every day | ORAL | Status: DC

## 2013-11-16 ENCOUNTER — Ambulatory Visit
Admission: RE | Admit: 2013-11-16 | Discharge: 2013-11-16 | Disposition: A | Discharge status: Home / Self Care | Payer: BC Managed Care – PPO | Source: Ambulatory Visit | Attending: Obstetrics and Gynecology | Admitting: Obstetrics and Gynecology

## 2013-11-16 DIAGNOSIS — R928 Other abnormal and inconclusive findings on diagnostic imaging of breast: Secondary | ICD-10-CM

## 2013-11-20 ENCOUNTER — Telehealth: Payer: Self-pay | Admitting: Internal Medicine

## 2013-11-20 NOTE — Telephone Encounter (Signed)
Pt's cpe was canc last week due to weather. Pt states her Synthroid dosage was changed and wants to know if you want her to wait a while before resch her cpe so that you can rechk levels at that time, or schedule soon? If so, do you want to create an appt? (none available)

## 2013-11-23 NOTE — Telephone Encounter (Signed)
We can do this either way  But if she not having immediate problem Can schedule CPX in about 6-8 weeks from change in medication ? ? Early April  t repeat TSH and Free t4 pre visit (CPX labs NOT NEEDED)

## 2013-11-24 NOTE — Telephone Encounter (Signed)
Pt has opted to do cpe in 6 weeks. Can you put in the tsh/free t4 labs?

## 2013-11-25 ENCOUNTER — Other Ambulatory Visit: Payer: Self-pay | Admitting: Family Medicine

## 2013-11-25 DIAGNOSIS — E039 Hypothyroidism, unspecified: Secondary | ICD-10-CM

## 2013-11-25 NOTE — Telephone Encounter (Signed)
Lab order entered into the system.

## 2013-12-23 ENCOUNTER — Other Ambulatory Visit (INDEPENDENT_AMBULATORY_CARE_PROVIDER_SITE_OTHER): Payer: BC Managed Care – PPO

## 2013-12-23 DIAGNOSIS — E039 Hypothyroidism, unspecified: Secondary | ICD-10-CM

## 2013-12-23 LAB — T4, FREE: Free T4: 0.99 ng/dL (ref 0.60–1.60)

## 2013-12-23 LAB — TSH: TSH: 1.13 u[IU]/mL (ref 0.35–5.50)

## 2013-12-30 ENCOUNTER — Ambulatory Visit (INDEPENDENT_AMBULATORY_CARE_PROVIDER_SITE_OTHER): Payer: BC Managed Care – PPO | Admitting: Internal Medicine

## 2013-12-30 ENCOUNTER — Encounter: Payer: Self-pay | Admitting: Internal Medicine

## 2013-12-30 VITALS — BP 110/74 | Temp 98.3°F | Ht 64.25 in | Wt 179.0 lb

## 2013-12-30 DIAGNOSIS — E039 Hypothyroidism, unspecified: Secondary | ICD-10-CM

## 2013-12-30 DIAGNOSIS — E669 Obesity, unspecified: Secondary | ICD-10-CM | POA: Insufficient documentation

## 2013-12-30 DIAGNOSIS — M722 Plantar fascial fibromatosis: Secondary | ICD-10-CM

## 2013-12-30 DIAGNOSIS — Z0000 Encounter for general adult medical examination without abnormal findings: Secondary | ICD-10-CM | POA: Insufficient documentation

## 2013-12-30 DIAGNOSIS — E785 Hyperlipidemia, unspecified: Secondary | ICD-10-CM

## 2013-12-30 MED ORDER — LEVOTHYROXINE SODIUM 100 MCG PO TABS: 100.0000 ug | ORAL_TABLET | Freq: Every day | ORAL | Status: DC

## 2013-12-30 NOTE — Patient Instructions (Addendum)
lifestyle intervention healthy eating and exercise . 150 minutes of exercise weeks  ,  Lose weight  To healthy levels. Avoid trans fats and processed foods;  Increase fresh fruits and veges to 5 servings per day. And avoid sweet beverages  Including tea and juice.   Heel pain  Seems like plantar fasciitis .   Changing to generic  thyroid medication  Check tsh in 3 months  Or 3 months after changing to a different generic .  Plantar Fasciitis (Heel Spur Syndrome) with Rehab The plantar fascia is a fibrous, ligament-like, soft-tissue structure that spans the bottom of the foot. Plantar fasciitis is a condition that causes pain in the foot due to inflammation of the tissue. SYMPTOMS   Pain and tenderness on the underneath side of the foot.  Pain that worsens with standing or walking. CAUSES  Plantar fasciitis is caused by irritation and injury to the plantar fascia on the underneath side of the foot. Common mechanisms of injury include:  Direct trauma to bottom of the foot.  Damage to a small nerve that runs under the foot where the main fascia attaches to the heel bone.  Stress placed on the plantar fascia due to bone spurs. RISK INCREASES WITH:   Activities that place stress on the plantar fascia (running, jumping, pivoting, or cutting).  Poor strength and flexibility.  Improperly fitted shoes.  Tight calf muscles.  Flat feet.  Failure to warm-up properly before activity.  Obesity. PREVENTION  Warm up and stretch properly before activity.  Allow for adequate recovery between workouts.  Maintain physical fitness:  Strength, flexibility, and endurance.  Cardiovascular fitness.  Maintain a health body weight.  Avoid stress on the plantar fascia.  Wear properly fitted shoes, including arch supports for individuals who have flat feet. PROGNOSIS  If treated properly, then the symptoms of plantar fasciitis usually resolve without surgery. However, occasionally  surgery is necessary. RELATED COMPLICATIONS   Recurrent symptoms that may result in a chronic condition.  Problems of the lower back that are caused by compensating for the injury, such as limping.  Pain or weakness of the foot during push-off following surgery.  Chronic inflammation, scarring, and partial or complete fascia tear, occurring more often from repeated injections. TREATMENT  Treatment initially involves the use of ice and medication to help reduce pain and inflammation. The use of strengthening and stretching exercises may help reduce pain with activity, especially stretches of the Achilles tendon. These exercises may be performed at home or with a therapist. Your caregiver may recommend that you use heel cups of arch supports to help reduce stress on the plantar fascia. Occasionally, corticosteroid injections are given to reduce inflammation. If symptoms persist for greater than 6 months despite non-surgical (conservative), then surgery may be recommended.  MEDICATION   If pain medication is necessary, then nonsteroidal anti-inflammatory medications, such as aspirin and ibuprofen, or other minor pain relievers, such as acetaminophen, are often recommended.  Do not take pain medication within 7 days before surgery.  Prescription pain relievers may be given if deemed necessary by your caregiver. Use only as directed and only as much as you need.  Corticosteroid injections may be given by your caregiver. These injections should be reserved for the most serious cases, because they may only be given a certain number of times. HEAT AND COLD  Cold treatment (icing) relieves pain and reduces inflammation. Cold treatment should be applied for 10 to 15 minutes every 2 to 3 hours for inflammation and  pain and immediately after any activity that aggravates your symptoms. Use ice packs or massage the area with a piece of ice (ice massage).  Heat treatment may be used prior to performing the  stretching and strengthening activities prescribed by your caregiver, physical therapist, or athletic trainer. Use a heat pack or soak the injury in warm water. SEEK IMMEDIATE MEDICAL CARE IF:  Treatment seems to offer no benefit, or the condition worsens.  Any medications produce adverse side effects. EXERCISES RANGE OF MOTION (ROM) AND STRETCHING EXERCISES - Plantar Fasciitis (Heel Spur Syndrome) These exercises may help you when beginning to rehabilitate your injury. Your symptoms may resolve with or without further involvement from your physician, physical therapist or athletic trainer. While completing these exercises, remember:   Restoring tissue flexibility helps normal motion to return to the joints. This allows healthier, less painful movement and activity.  An effective stretch should be held for at least 30 seconds.  A stretch should never be painful. You should only feel a gentle lengthening or release in the stretched tissue. RANGE OF MOTION - Toe Extension, Flexion  Sit with your right / left leg crossed over your opposite knee.  Grasp your toes and gently pull them back toward the top of your foot. You should feel a stretch on the bottom of your toes and/or foot.  Hold this stretch for __________ seconds.  Now, gently pull your toes toward the bottom of your foot. You should feel a stretch on the top of your toes and or foot.  Hold this stretch for __________ seconds. Repeat __________ times. Complete this stretch __________ times per day.  RANGE OF MOTION - Ankle Dorsiflexion, Active Assisted  Remove shoes and sit on a chair that is preferably not on a carpeted surface.  Place right / left foot under knee. Extend your opposite leg for support.  Keeping your heel down, slide your right / left foot back toward the chair until you feel a stretch at your ankle or calf. If you do not feel a stretch, slide your bottom forward to the edge of the chair, while still keeping  your heel down.  Hold this stretch for __________ seconds. Repeat __________ times. Complete this stretch __________ times per day.  STRETCH  Gastroc, Standing  Place hands on wall.  Extend right / left leg, keeping the front knee somewhat bent.  Slightly point your toes inward on your back foot.  Keeping your right / left heel on the floor and your knee straight, shift your weight toward the wall, not allowing your back to arch.  You should feel a gentle stretch in the right / left calf. Hold this position for __________ seconds. Repeat __________ times. Complete this stretch __________ times per day. STRETCH  Soleus, Standing  Place hands on wall.  Extend right / left leg, keeping the other knee somewhat bent.  Slightly point your toes inward on your back foot.  Keep your right / left heel on the floor, bend your back knee, and slightly shift your weight over the back leg so that you feel a gentle stretch deep in your back calf.  Hold this position for __________ seconds. Repeat __________ times. Complete this stretch __________ times per day. STRETCH  Gastrocsoleus, Standing  Note: This exercise can place a lot of stress on your foot and ankle. Please complete this exercise only if specifically instructed by your caregiver.   Place the ball of your right / left foot on a step, keeping  your other foot firmly on the same step.  Hold on to the wall or a rail for balance.  Slowly lift your other foot, allowing your body weight to press your heel down over the edge of the step.  You should feel a stretch in your right / left calf.  Hold this position for __________ seconds.  Repeat this exercise with a slight bend in your right / left knee. Repeat __________ times. Complete this stretch __________ times per day.  STRENGTHENING EXERCISES - Plantar Fasciitis (Heel Spur Syndrome)  These exercises may help you when beginning to rehabilitate your injury. They may resolve your  symptoms with or without further involvement from your physician, physical therapist or athletic trainer. While completing these exercises, remember:   Muscles can gain both the endurance and the strength needed for everyday activities through controlled exercises.  Complete these exercises as instructed by your physician, physical therapist or athletic trainer. Progress the resistance and repetitions only as guided. STRENGTH - Towel Curls  Sit in a chair positioned on a non-carpeted surface.  Place your foot on a towel, keeping your heel on the floor.  Pull the towel toward your heel by only curling your toes. Keep your heel on the floor.  If instructed by your physician, physical therapist or athletic trainer, add ____________________ at the end of the towel. Repeat __________ times. Complete this exercise __________ times per day. STRENGTH - Ankle Inversion  Secure one end of a rubber exercise band/tubing to a fixed object (table, pole). Loop the other end around your foot just before your toes.  Place your fists between your knees. This will focus your strengthening at your ankle.  Slowly, pull your big toe up and in, making sure the band/tubing is positioned to resist the entire motion.  Hold this position for __________ seconds.  Have your muscles resist the band/tubing as it slowly pulls your foot back to the starting position. Repeat __________ times. Complete this exercises __________ times per day.  Document Released: 09/10/2005 Document Revised: 12/03/2011 Document Reviewed: 12/23/2008 [facility] Patient Information 2014 [place], [place].

## 2013-12-30 NOTE — Progress Notes (Signed)
Chief Complaint  Patient presents with  . Annual Exam  . Hypothyroidism    HPI: Patient comes in today for Preventive Health Care visit  Since last visit tonsils ok  Had coldness and tsh elevated   Branded and taken medication separately. Had hair change no weight change   Hard time losing weight but attending to this  No sodas  Other food changes   utd mammo gyne   Health Maintenance  Topic Date Due  . Influenza Vaccine  04/24/2014  . Pap Smear  07/08/2015  . Tetanus/tdap  04/01/2022   Health Maintenance Review ROS:  Right heel pain  Worse in am  No injury using inserts.  GEN/ HEENT: No fever, significant weight changes sweats headaches vision problems hearing changes, CV/ PULM; No chest pain shortness of breath cough, syncope,edema  change in exercise tolerance. GI /GU: No adominal pain, vomiting, change in bowel habits. No blood in the stool. No significant GU symptoms. SKIN/HEME: ,no acute skin rashes suspicious lesions or bleeding. No lymphadenopathy, nodules, masses.  NEURO/ PSYCH:  No neurologic signs such as weakness numbness. No depression anxiety. IMM/ Allergy: No unusual infections.  Allergy .   REST of 12 system review negative except as per HPI   Past Medical History  Diagnosis Date  . Allergy   . Heart murmur     echo in 98  . Hypothyroidism     pos antibodies  goiter     Family History  Problem Relation Age of Onset  . Cancer Mother     ovarian and cervical  . Cancer Maternal Grandmother     lung  . Cancer Maternal Grandfather     lung and pancreatic  . Coronary artery disease Father     stent    History   Social History  . Marital Status: Single    Spouse Name: N/A    Number of Children: N/A  . Years of Education: N/A   Social History Main Topics  . Smoking status: Never Smoker   . Smokeless tobacco: Never Used  . Alcohol Use: Yes     Comment: once a month  . Drug Use: No  . Sexual Activity: No   Other Topics Concern  . None     Social History Narrative   HH of 1   Pet cat.   ocass exercise .   No tob    rare etoh   caffiene  2 per day   Work  40 hours per week.    Good sleep.     Outpatient Encounter Prescriptions as of 12/30/2013  Medication Sig  . fluticasone (FLONASE) 50 MCG/ACT nasal spray INSTILL 2 SPRAYS IN EACH NOSTRIL EVERY DAY AS NEEDED FOR ALLERGIES AND SINUSITIS  . levonorgestrel (MIRENA) 20 MCG/24HR IUD 1 each by Intrauterine route once.  . [DISCONTINUED] SYNTHROID 100 MCG tablet Take 1 tablet (100 mcg total) by mouth daily before breakfast.  . levothyroxine (SYNTHROID, LEVOTHROID) 100 MCG tablet Take 1 tablet (100 mcg total) by mouth daily.  . [DISCONTINUED] azithromycin (ZITHROMAX) 500 MG tablet Take 1 tablet (500 mg total) by mouth daily.  . [DISCONTINUED] Cephalexin 500 MG tablet Take 1 tablet (500 mg total) by mouth 3 (three) times daily.  . [DISCONTINUED] HYDROcodone-acetaminophen (HYCET) 7.5-325 mg/15 ml solution Take 5 mLs by mouth every 6 (six) hours as needed for pain (or cough).    EXAM:  BP 110/74  Temp(Src) 98.3 F (36.8 C) (Oral)  Ht 5' 4.25" (1.632 m)  Wt  179 lb (81.194 kg)  BMI 30.48 kg/m2  Body mass index is 30.48 kg/(m^2).  Physical Exam: Vital signs reviewed ZOX:WRUE is a well-developed well-nourished alert cooperative    who appearsr stated age in no acute distress.  HEENT: normocephalic atraumatic , Eyes: PERRL EOM's full, conjunctiva clear, Nares: paten,t no deformity discharge or tenderness., Ears: no deformity EAC's clear TMs with normal landmarks. Mouth: clear OP, no lesions, edema.  Moist mucous membranes. Dentition in adequate repair. NECK: supple without masses, t or bruits. CHEST/PULM:  Clear to auscultation and percussion breath sounds equal no wheeze , rales or rhonchi. No chest wall deformities or tenderness. CV: PMI is nondisplaced, S1 S2 no gallops, murmurs, rubs. Peripheral pulses are full without delay.No JVD .  ABDOMEN: Bowel sounds normal nontender   No guard or rebound, no hepato splenomegal no CVA tenderness.  No hernia. Extremtities:  No clubbing cyanosis or edema, no acute joint swelling or redness no focal atrophy tender mid heel pad no deformity sitting gait not checked  No redeness of fb  NEURO:  Oriented x3, cranial nerves 3-12 appear to be intact, no obvious focal weakness,gait within normal limits no abnormal reflexes or asymmetrical SKIN: No acute rashes normal turgor, color, no bruising or petechiae. PSYCH: Oriented, good eye contact, no obvious depression anxiety, cognition and judgment appear normal. LN: no cervical axillary inguinal adenopathy  Lab Results  Component Value Date   WBC 7.6 11/03/2013   HGB 12.9 11/03/2013   HCT 38.5 11/03/2013   PLT 198.0 11/03/2013   GLUCOSE 81 11/03/2013   CHOL 198 11/03/2013   TRIG 151.0* 11/03/2013   HDL 35.90* 11/03/2013   LDLCALC 132* 11/03/2013   ALT 22 11/03/2013   AST 26 11/03/2013   NA 142 11/03/2013   K 4.6 11/03/2013   CL 106 11/03/2013   CREATININE 1.1 11/03/2013   BUN 15 11/03/2013   CO2 29 11/03/2013   TSH 1.13 12/23/2013    ASSESSMENT AND PLAN:  Discussed the following assessment and plan:  Visit for preventive health examination  Unspecified hypothyroidism - pt request other cause of cost 30$ per month ok to try and check tsh . reviewed monitoring protchol  Plantar fasciitis of left foot  Hyperlipidemia - i lsi reveiwed conservative rx heel cups ice stretch etc  Fu if  persistent or progressive  Patient Care Team: Madelin Headings, MD as PCP - General Purcell Nails, MD (Obstetrics and Gynecology) Michel Harrow, MD (Ophthalmology) dermatologist Patient Instructions  lifestyle intervention healthy eating and exercise . 150 minutes of exercise weeks  ,  Lose weight  To healthy levels. Avoid trans fats and processed foods;  Increase fresh fruits and veges to 5 servings per day. And avoid sweet beverages  Including tea and juice.   Heel pain  Seems like plantar  fasciitis .   Changing to generic  thyroid medication  Check tsh in 3 months  Or 3 months after changing to a different generic .  Plantar Fasciitis (Heel Spur Syndrome) with Rehab The plantar fascia is a fibrous, ligament-like, soft-tissue structure that spans the bottom of the foot. Plantar fasciitis is a condition that causes pain in the foot due to inflammation of the tissue. SYMPTOMS   Pain and tenderness on the underneath side of the foot.  Pain that worsens with standing or walking. CAUSES  Plantar fasciitis is caused by irritation and injury to the plantar fascia on the underneath side of the foot. Common mechanisms of injury include:  Direct  trauma to bottom of the foot.  Damage to a small nerve that runs under the foot where the main fascia attaches to the heel bone.  Stress placed on the plantar fascia due to bone spurs. RISK INCREASES WITH:   Activities that place stress on the plantar fascia (running, jumping, pivoting, or cutting).  Poor strength and flexibility.  Improperly fitted shoes.  Tight calf muscles.  Flat feet.  Failure to warm-up properly before activity.  Obesity. PREVENTION  Warm up and stretch properly before activity.  Allow for adequate recovery between workouts.  Maintain physical fitness:  Strength, flexibility, and endurance.  Cardiovascular fitness.  Maintain a health body weight.  Avoid stress on the plantar fascia.  Wear properly fitted shoes, including arch supports for individuals who have flat feet. PROGNOSIS  If treated properly, then the symptoms of plantar fasciitis usually resolve without surgery. However, occasionally surgery is necessary. RELATED COMPLICATIONS   Recurrent symptoms that may result in a chronic condition.  Problems of the lower back that are caused by compensating for the injury, such as limping.  Pain or weakness of the foot during push-off following surgery.  Chronic inflammation, scarring,  and partial or complete fascia tear, occurring more often from repeated injections. TREATMENT  Treatment initially involves the use of ice and medication to help reduce pain and inflammation. The use of strengthening and stretching exercises may help reduce pain with activity, especially stretches of the Achilles tendon. These exercises may be performed at home or with a therapist. Your caregiver may recommend that you use heel cups of arch supports to help reduce stress on the plantar fascia. Occasionally, corticosteroid injections are given to reduce inflammation. If symptoms persist for greater than 6 months despite non-surgical (conservative), then surgery may be recommended.  MEDICATION   If pain medication is necessary, then nonsteroidal anti-inflammatory medications, such as aspirin and ibuprofen, or other minor pain relievers, such as acetaminophen, are often recommended.  Do not take pain medication within 7 days before surgery.  Prescription pain relievers may be given if deemed necessary by your caregiver. Use only as directed and only as much as you need.  Corticosteroid injections may be given by your caregiver. These injections should be reserved for the most serious cases, because they may only be given a certain number of times. HEAT AND COLD  Cold treatment (icing) relieves pain and reduces inflammation. Cold treatment should be applied for 10 to 15 minutes every 2 to 3 hours for inflammation and pain and immediately after any activity that aggravates your symptoms. Use ice packs or massage the area with a piece of ice (ice massage).  Heat treatment may be used prior to performing the stretching and strengthening activities prescribed by your caregiver, physical therapist, or athletic trainer. Use a heat pack or soak the injury in warm water. SEEK IMMEDIATE MEDICAL CARE IF:  Treatment seems to offer no benefit, or the condition worsens.  Any medications produce adverse side  effects. EXERCISES RANGE OF MOTION (ROM) AND STRETCHING EXERCISES - Plantar Fasciitis (Heel Spur Syndrome) These exercises may help you when beginning to rehabilitate your injury. Your symptoms may resolve with or without further involvement from your physician, physical therapist or athletic trainer. While completing these exercises, remember:   Restoring tissue flexibility helps normal motion to return to the joints. This allows healthier, less painful movement and activity.  An effective stretch should be held for at least 30 seconds.  A stretch should never be painful. You should only  feel a gentle lengthening or release in the stretched tissue. RANGE OF MOTION - Toe Extension, Flexion  Sit with your right / left leg crossed over your opposite knee.  Grasp your toes and gently pull them back toward the top of your foot. You should feel a stretch on the bottom of your toes and/or foot.  Hold this stretch for __________ seconds.  Now, gently pull your toes toward the bottom of your foot. You should feel a stretch on the top of your toes and or foot.  Hold this stretch for __________ seconds. Repeat __________ times. Complete this stretch __________ times per day.  RANGE OF MOTION - Ankle Dorsiflexion, Active Assisted  Remove shoes and sit on a chair that is preferably not on a carpeted surface.  Place right / left foot under knee. Extend your opposite leg for support.  Keeping your heel down, slide your right / left foot back toward the chair until you feel a stretch at your ankle or calf. If you do not feel a stretch, slide your bottom forward to the edge of the chair, while still keeping your heel down.  Hold this stretch for __________ seconds. Repeat __________ times. Complete this stretch __________ times per day.  STRETCH  Gastroc, Standing  Place hands on wall.  Extend right / left leg, keeping the front knee somewhat bent.  Slightly point your toes inward on your back  foot.  Keeping your right / left heel on the floor and your knee straight, shift your weight toward the wall, not allowing your back to arch.  You should feel a gentle stretch in the right / left calf. Hold this position for __________ seconds. Repeat __________ times. Complete this stretch __________ times per day. STRETCH  Soleus, Standing  Place hands on wall.  Extend right / left leg, keeping the other knee somewhat bent.  Slightly point your toes inward on your back foot.  Keep your right / left heel on the floor, bend your back knee, and slightly shift your weight over the back leg so that you feel a gentle stretch deep in your back calf.  Hold this position for __________ seconds. Repeat __________ times. Complete this stretch __________ times per day. STRETCH  Gastrocsoleus, Standing  Note: This exercise can place a lot of stress on your foot and ankle. Please complete this exercise only if specifically instructed by your caregiver.   Place the ball of your right / left foot on a step, keeping your other foot firmly on the same step.  Hold on to the wall or a rail for balance.  Slowly lift your other foot, allowing your body weight to press your heel down over the edge of the step.  You should feel a stretch in your right / left calf.  Hold this position for __________ seconds.  Repeat this exercise with a slight bend in your right / left knee. Repeat __________ times. Complete this stretch __________ times per day.  STRENGTHENING EXERCISES - Plantar Fasciitis (Heel Spur Syndrome)  These exercises may help you when beginning to rehabilitate your injury. They may resolve your symptoms with or without further involvement from your physician, physical therapist or athletic trainer. While completing these exercises, remember:   Muscles can gain both the endurance and the strength needed for everyday activities through controlled exercises.  Complete these exercises as  instructed by your physician, physical therapist or athletic trainer. Progress the resistance and repetitions only as guided. STRENGTH - Towel Curls  Sit in a chair positioned on a non-carpeted surface.  Place your foot on a towel, keeping your heel on the floor.  Pull the towel toward your heel by only curling your toes. Keep your heel on the floor.  If instructed by your physician, physical therapist or athletic trainer, add ____________________ at the end of the towel. Repeat __________ times. Complete this exercise __________ times per day. STRENGTH - Ankle Inversion  Secure one end of a rubber exercise band/tubing to a fixed object (table, pole). Loop the other end around your foot just before your toes.  Place your fists between your knees. This will focus your strengthening at your ankle.  Slowly, pull your big toe up and in, making sure the band/tubing is positioned to resist the entire motion.  Hold this position for __________ seconds.  Have your muscles resist the band/tubing as it slowly pulls your foot back to the starting position. Repeat __________ times. Complete this exercises __________ times per day.  Document Released: 09/10/2005 Document Revised: 12/03/2011 Document Reviewed: 12/23/2008 [facility] Patient Information 2014 [place], [place].    Neta Mends. Aloha Bartok M.D.   Pre visit review using our clinic review tool, if applicable. No additional management support is needed unless otherwise documented below in the visit note.

## 2014-08-11 ENCOUNTER — Other Ambulatory Visit: Payer: Self-pay | Admitting: Internal Medicine

## 2014-08-12 NOTE — Telephone Encounter (Signed)
Sent to the pharmacy by e-scribe. 

## 2014-08-16 ENCOUNTER — Ambulatory Visit (INDEPENDENT_AMBULATORY_CARE_PROVIDER_SITE_OTHER): Payer: BC Managed Care – PPO | Admitting: Family Medicine

## 2014-08-16 ENCOUNTER — Encounter: Payer: Self-pay | Admitting: Family Medicine

## 2014-08-16 VITALS — BP 104/63 | HR 65 | Temp 98.2°F | Ht 64.25 in | Wt 179.0 lb

## 2014-08-16 DIAGNOSIS — J209 Acute bronchitis, unspecified: Secondary | ICD-10-CM

## 2014-08-16 MED ORDER — AZITHROMYCIN 250 MG PO TABS: ORAL_TABLET | ORAL | Status: DC

## 2014-08-16 MED ORDER — HYDROCODONE-HOMATROPINE 5-1.5 MG/5ML PO SYRP: 5.0000 mL | ORAL_SOLUTION | ORAL | Status: DC | PRN

## 2014-08-16 NOTE — Progress Notes (Signed)
   Subjective:    Patient ID: Lisa Bradford, female    DOB: 1971-05-11, 43 y.o.   MRN: [id number]  HPI Here for one week of stuffy head, PND, chest tightness and coughing up yellow sputum. On Mucinex    Review of Systems  Constitutional: Negative.   HENT: Positive for congestion and postnasal drip.   Eyes: Negative.   Respiratory: Positive for cough and chest tightness.        Objective:   Physical Exam  Constitutional: She appears well-developed and well-nourished.  HENT:  Right Ear: External ear normal.  Left Ear: External ear normal.  Nose: Nose normal.  Mouth/Throat: Oropharynx is clear and moist.  Eyes: Conjunctivae are normal.  Pulmonary/Chest: Effort normal and breath sounds normal.  Lymphadenopathy:    She has no cervical adenopathy.          Assessment & Plan:  Recheck prn

## 2014-08-16 NOTE — Progress Notes (Signed)
Pre visit review using our clinic review tool, if applicable. No additional management support is needed unless otherwise documented below in the visit note. 

## 2014-10-14 ENCOUNTER — Other Ambulatory Visit: Payer: Self-pay

## 2014-10-14 DIAGNOSIS — Z1231 Encounter for screening mammogram for malignant neoplasm of breast: Secondary | ICD-10-CM

## 2014-11-01 ENCOUNTER — Ambulatory Visit: Payer: Self-pay

## 2014-11-08 ENCOUNTER — Ambulatory Visit: Payer: Self-pay

## 2014-11-11 ENCOUNTER — Ambulatory Visit: Payer: Self-pay

## 2014-11-15 ENCOUNTER — Ambulatory Visit
Admission: RE | Admit: 2014-11-15 | Discharge: 2014-11-15 | Disposition: A | Discharge status: Home / Self Care | Payer: BLUE CROSS/BLUE SHIELD | Source: Ambulatory Visit

## 2014-11-15 DIAGNOSIS — Z1231 Encounter for screening mammogram for malignant neoplasm of breast: Secondary | ICD-10-CM

## 2015-02-07 ENCOUNTER — Other Ambulatory Visit: Payer: Self-pay | Admitting: Internal Medicine

## 2015-05-10 ENCOUNTER — Telehealth: Payer: Self-pay | Admitting: Internal Medicine

## 2015-05-10 NOTE — Telephone Encounter (Signed)
Pt request refill levothyroxine (SYNTHROID, LEVOTHROID) 100 MCG tablet [facility]/ [address]  Pt not seen since 12/2013. Do you want to work pt in sooner? Pt's Appt is 11/9 and will need 90 day to get her through. Is that OK?

## 2015-05-10 NOTE — Telephone Encounter (Signed)
Ok to refill  Enough until cpx  If doin ok  No refill s

## 2015-05-11 MED ORDER — LEVOTHYROXINE SODIUM 100 MCG PO TABS: 100.0000 ug | ORAL_TABLET | Freq: Every day | ORAL | Status: DC

## 2015-05-11 NOTE — Telephone Encounter (Signed)
Sent to the pharmacy by e-scribe. 

## 2015-07-28 ENCOUNTER — Other Ambulatory Visit (INDEPENDENT_AMBULATORY_CARE_PROVIDER_SITE_OTHER): Payer: BLUE CROSS/BLUE SHIELD

## 2015-07-28 DIAGNOSIS — R7989 Other specified abnormal findings of blood chemistry: Secondary | ICD-10-CM

## 2015-07-28 DIAGNOSIS — Z0000 Encounter for general adult medical examination without abnormal findings: Secondary | ICD-10-CM | POA: Diagnosis not present

## 2015-07-28 LAB — CBC WITH DIFFERENTIAL/PLATELET
BASOS ABS: 0 10*3/uL (ref 0.0–0.1)
Basophils Relative: 0.5 % (ref 0.0–3.0)
EOS PCT: 4.9 % (ref 0.0–5.0)
Eosinophils Absolute: 0.4 10*3/uL (ref 0.0–0.7)
HCT: 38.7 % (ref 36.0–46.0)
HEMOGLOBIN: 13 g/dL (ref 12.0–15.0)
LYMPHS ABS: 2.4 10*3/uL (ref 0.7–4.0)
Lymphocytes Relative: 31.5 % (ref 12.0–46.0)
MCHC: 33.7 g/dL (ref 30.0–36.0)
MCV: 87.4 fl (ref 78.0–100.0)
MONO ABS: 0.5 10*3/uL (ref 0.1–1.0)
MONOS PCT: 7.1 % (ref 3.0–12.0)
NEUTROS PCT: 56 % (ref 43.0–77.0)
Neutro Abs: 4.3 10*3/uL (ref 1.4–7.7)
Platelets: 186 10*3/uL (ref 150.0–400.0)
RBC: 4.42 Mil/uL (ref 3.87–5.11)
RDW: 12.5 % (ref 11.5–15.5)
WBC: 7.7 10*3/uL (ref 4.0–10.5)

## 2015-07-28 LAB — LIPID PANEL
Cholesterol: 169 mg/dL (ref 0–200)
HDL: 38.8 mg/dL — AB (ref >39.00)
NONHDL: 130.17
Total CHOL/HDL Ratio: 4
Triglycerides: 225 mg/dL — ABNORMAL HIGH (ref 0.0–149.0)
VLDL: 45 mg/dL — AB (ref 0.0–40.0)

## 2015-07-28 LAB — TSH: TSH: 0.5 u[IU]/mL (ref 0.35–4.50)

## 2015-07-28 LAB — BASIC METABOLIC PANEL
BUN: 15 mg/dL (ref 6–23)
CALCIUM: 9.8 mg/dL (ref 8.4–10.5)
CO2: 30 mEq/L (ref 19–32)
Chloride: 104 mEq/L (ref 96–112)
Creatinine, Ser: 0.88 mg/dL (ref 0.40–1.20)
GFR: 73.74 mL/min (ref >60.00)
Glucose, Bld: 102 mg/dL — ABNORMAL HIGH (ref 70–99)
POTASSIUM: 4.2 meq/L (ref 3.5–5.1)
SODIUM: 140 meq/L (ref 135–145)

## 2015-07-28 LAB — HEPATIC FUNCTION PANEL
ALK PHOS: 61 U/L (ref 39–117)
ALT: 18 U/L (ref 0–35)
AST: 16 U/L (ref 0–37)
Albumin: 4 g/dL (ref 3.5–5.2)
BILIRUBIN TOTAL: 0.3 mg/dL (ref 0.2–1.2)
Bilirubin, Direct: 0.1 mg/dL (ref 0.0–0.3)
Total Protein: 7.6 g/dL (ref 6.0–8.3)

## 2015-07-28 LAB — LDL CHOLESTEROL, DIRECT: Direct LDL: 109 mg/dL

## 2015-08-03 ENCOUNTER — Encounter: Payer: BLUE CROSS/BLUE SHIELD | Admitting: Internal Medicine

## 2015-08-03 NOTE — Progress Notes (Signed)
Document opened and reviewed for OVcpx but appt  Changed  same day .

## 2015-08-03 NOTE — Patient Instructions (Signed)
Health Maintenance, Female Adopting a healthy lifestyle and getting preventive care can go a long way to promote health and wellness. Talk with your health care provider about what schedule of regular examinations is right for you. This is a good chance for you to check in with your provider about disease prevention and staying healthy. In between checkups, there are plenty of things you can do on your own. Experts have done a lot of research about which lifestyle changes and preventive measures are most likely to keep you healthy. Ask your health care provider for more information. WEIGHT AND DIET  Eat a healthy diet  Be sure to include plenty of vegetables, fruits, low-fat dairy products, and lean protein.  Do not eat a lot of foods high in solid fats, added sugars, or salt.  Get regular exercise. This is one of the most important things you can do for your health.  Most adults should exercise for at least 150 minutes each week. The exercise should increase your heart rate and make you sweat (moderate-intensity exercise).  Most adults should also do strengthening exercises at least twice a week. This is in addition to the moderate-intensity exercise.  Maintain a healthy weight  Body mass index (BMI) is a measurement that can be used to identify possible weight problems. It estimates body fat based on height and weight. Your health care provider can help determine your BMI and help you achieve or maintain a healthy weight.  For females 20 years of age and older:   A BMI below 18.5 is considered underweight.  A BMI of 18.5 to 24.9 is normal.  A BMI of 25 to 29.9 is considered overweight.  A BMI of 30 and above is considered obese.  Watch levels of cholesterol and blood lipids  You should start having your blood tested for lipids and cholesterol at 45 years of age, then have this test every 5 years.  You may need to have your cholesterol levels checked more often if:  Your lipid  or cholesterol levels are high.  You are older than 45 years of age.  You are at high risk for heart disease.  CANCER SCREENING   Lung Cancer  Lung cancer screening is recommended for adults 55-80 years old who are at high risk for lung cancer because of a history of smoking.  A yearly low-dose CT scan of the lungs is recommended for people who:  Currently smoke.  Have quit within the past 15 years.  Have at least a 30-pack-year history of smoking. A pack year is smoking an average of one pack of cigarettes a day for 1 year.  Yearly screening should continue until it has been 15 years since you quit.  Yearly screening should stop if you develop a health problem that would prevent you from having lung cancer treatment.  Breast Cancer  Practice breast self-awareness. This means understanding how your breasts normally appear and feel.  It also means doing regular breast self-exams. Let your health care provider know about any changes, no matter how small.  If you are in your 20s or 30s, you should have a clinical breast exam (CBE) by a health care provider every 1-3 years as part of a regular health exam.  If you are 40 or older, have a CBE every year. Also consider having a breast X-ray (mammogram) every year.  If you have a family history of breast cancer, talk to your health care provider about genetic screening.  If you   are at high risk for breast cancer, talk to your health care provider about having an MRI and a mammogram every year.  Breast cancer gene (BRCA) assessment is recommended for women who have family members with BRCA-related cancers. BRCA-related cancers include:  Breast.  Ovarian.  Tubal.  Peritoneal cancers.  Results of the assessment will determine the need for genetic counseling and BRCA1 and BRCA2 testing. Cervical Cancer Your health care provider may recommend that you be screened regularly for cancer of the pelvic organs (ovaries, uterus, and  vagina). This screening involves a pelvic examination, including checking for microscopic changes to the surface of your cervix (Pap test). You may be encouraged to have this screening done every 3 years, beginning at age 21.  For women ages 30-65, health care providers may recommend pelvic exams and Pap testing every 3 years, or they may recommend the Pap and pelvic exam, combined with testing for human papilloma virus (HPV), every 5 years. Some types of HPV increase your risk of cervical cancer. Testing for HPV may also be done on women of any age with unclear Pap test results.  Other health care providers may not recommend any screening for nonpregnant women who are considered low risk for pelvic cancer and who do not have symptoms. Ask your health care provider if a screening pelvic exam is right for you.  If you have had past treatment for cervical cancer or a condition that could lead to cancer, you need Pap tests and screening for cancer for at least 20 years after your treatment. If Pap tests have been discontinued, your risk factors (such as having a new sexual partner) need to be reassessed to determine if screening should resume. Some women have medical problems that increase the chance of getting cervical cancer. In these cases, your health care provider may recommend more frequent screening and Pap tests. Colorectal Cancer  This type of cancer can be detected and often prevented.  Routine colorectal cancer screening usually begins at 45 years of age and continues through 45 years of age.  Your health care provider may recommend screening at an earlier age if you have risk factors for colon cancer.  Your health care provider may also recommend using home test kits to check for hidden blood in the stool.  A small camera at the end of a tube can be used to examine your colon directly (sigmoidoscopy or colonoscopy). This is done to check for the earliest forms of colorectal  cancer.  Routine screening usually begins at age 50.  Direct examination of the colon should be repeated every 5-10 years through 45 years of age. However, you may need to be screened more often if early forms of precancerous polyps or small growths are found. Skin Cancer  Check your skin from head to toe regularly.  Tell your health care provider about any new moles or changes in moles, especially if there is a change in a mole's shape or color.  Also tell your health care provider if you have a mole that is larger than the size of a pencil eraser.  Always use sunscreen. Apply sunscreen liberally and repeatedly throughout the day.  Protect yourself by wearing long sleeves, pants, a wide-brimmed hat, and sunglasses whenever you are outside. HEART DISEASE, DIABETES, AND HIGH BLOOD PRESSURE   High blood pressure causes heart disease and increases the risk of stroke. High blood pressure is more likely to develop in:  People who have blood pressure in the high end   of the normal range (130-139/85-89 mm Hg).  People who are overweight or obese.  People who are African American.  If you are 18-39 years of age, have your blood pressure checked every 3-5 years. If you are 40 years of age or older, have your blood pressure checked every year. You should have your blood pressure measured twice--once when you are at a hospital or clinic, and once when you are not at a hospital or clinic. Record the average of the two measurements. To check your blood pressure when you are not at a hospital or clinic, you can use:  An automated blood pressure machine at a pharmacy.  A home blood pressure monitor.  If you are between 55 years and 79 years old, ask your health care provider if you should take aspirin to prevent strokes.  Have regular diabetes screenings. This involves taking a blood sample to check your fasting blood sugar level.  If you are at a normal weight and have a low risk for diabetes,  have this test once every three years after 45 years of age.  If you are overweight and have a high risk for diabetes, consider being tested at a younger age or more often. PREVENTING INFECTION  Hepatitis B  If you have a higher risk for hepatitis B, you should be screened for this virus. You are considered at high risk for hepatitis B if:  You were born in a country where hepatitis B is common. Ask your health care provider which countries are considered high risk.  Your parents were born in a high-risk country, and you have not been immunized against hepatitis B (hepatitis B vaccine).  You have HIV or AIDS.  You use needles to inject street drugs.  You live with someone who has hepatitis B.  You have had sex with someone who has hepatitis B.  You get hemodialysis treatment.  You take certain medicines for conditions, including cancer, organ transplantation, and autoimmune conditions. Hepatitis C  Blood testing is recommended for:  Everyone born from 1945 through 1965.  Anyone with known risk factors for hepatitis C. Sexually transmitted infections (STIs)  You should be screened for sexually transmitted infections (STIs) including gonorrhea and chlamydia if:  You are sexually active and are younger than 45 years of age.  You are older than 45 years of age and your health care provider tells you that you are at risk for this type of infection.  Your sexual activity has changed since you were last screened and you are at an increased risk for chlamydia or gonorrhea. Ask your health care provider if you are at risk.  If you do not have HIV, but are at risk, it may be recommended that you take a prescription medicine daily to prevent HIV infection. This is called pre-exposure prophylaxis (PrEP). You are considered at risk if:  You are sexually active and do not regularly use condoms or know the HIV status of your partner(s).  You take drugs by injection.  You are sexually  active with a partner who has HIV. Talk with your health care provider about whether you are at high risk of being infected with HIV. If you choose to begin PrEP, you should first be tested for HIV. You should then be tested every 3 months for as long as you are taking PrEP.  PREGNANCY   If you are premenopausal and you may become pregnant, ask your health care provider about preconception counseling.  If you may   you may become pregnant, take 400 to 800 micrograms (mcg) of folic acid every day.  If you want to prevent pregnancy, talk to your health care provider about birth control (contraception). OSTEOPOROSIS AND MENOPAUSE   Osteoporosis is a disease in which the bones lose minerals and strength with aging. This can result in serious bone fractures. Your risk for osteoporosis can be identified using a bone density scan.  If you are 57 years of age or older, or if you are at risk for osteoporosis and fractures, ask your health care provider if you should be screened.  Ask your health care provider whether you should take a calcium or vitamin D supplement to lower your risk for osteoporosis.  Menopause may have certain physical symptoms and risks.  Hormone replacement therapy may reduce some of these symptoms and risks. Talk to your health care provider about whether hormone replacement therapy is right for you.  HOME CARE INSTRUCTIONS   Schedule regular health, dental, and eye exams.  Stay current with your immunizations.   Do not use any tobacco products including cigarettes, chewing tobacco, or electronic cigarettes.  If you are pregnant, do not drink alcohol.  If you are breastfeeding, limit how much and how often you drink alcohol.  Limit alcohol intake to no more than 1 drink per day for nonpregnant women. One drink equals 12 ounces of beer, 5 ounces of wine, or 1 ounces of hard liquor.  Do not use street drugs.  Do not share needles.  Ask your health care provider for help if  you need support or information about quitting drugs.  Tell your health care provider if you often feel depressed.  Tell your health care provider if you have ever been abused or do not feel safe at home.   This information is not intended to replace advice given to you by your health care provider. Make sure you discuss any questions you have with your health care provider.   Document Released: 03/26/2011 Document Revised: 10/01/2014 Document Reviewed: 08/12/2013 Elsevier Interactive Patient Education [employer].    Why follow it? Research shows. . Those who follow the Mediterranean diet have a reduced risk of heart disease  . The diet is associated with a reduced incidence of Parkinson's and Alzheimer's diseases . People following the diet may have longer life expectancies and lower rates of chronic diseases  . The Dietary Guidelines for Americans recommends the Mediterranean diet as an eating plan to promote health and prevent disease  What Is the Mediterranean Diet?  . Healthy eating plan based on typical foods and recipes of Mediterranean-style cooking . The diet is primarily a plant based diet; these foods should make up a majority of meals   Starches - Plant based foods should make up a majority of meals - They are an important sources of vitamins, minerals, energy, antioxidants, and fiber - Choose whole grains, foods high in fiber and minimally processed items  - Typical grain sources include wheat, oats, barley, corn, brown rice, bulgar, farro, millet, polenta, couscous  - Various types of beans include chickpeas, lentils, fava beans, black beans, white beans   Fruits  Veggies - Large quantities of antioxidant rich fruits & veggies; 6 or more servings  - Vegetables can be eaten raw or lightly drizzled with oil and cooked  - Vegetables common to the traditional Mediterranean Diet include: artichokes, arugula, beets, broccoli, brussel sprouts, cabbage, carrots, celery,  collard greens, cucumbers, eggplant, kale, leeks, lemons, lettuce, mushrooms, okra,  onions, peas, peppers, potatoes, pumpkin, radishes, rutabaga, shallots, spinach, sweet potatoes, turnips, zucchini - Fruits common to the Mediterranean Diet include: apples, apricots, avocados, cherries, clementines, dates, figs, grapefruits, grapes, melons, nectarines, oranges, peaches, pears, pomegranates, strawberries, tangerines  Fats - Replace butter and margarine with healthy oils, such as olive oil, canola oil, and tahini  - Limit nuts to no more than a handful a day  - Nuts include walnuts, almonds, pecans, pistachios, pine nuts  - Limit or avoid candied, honey roasted or heavily salted nuts - Olives are central to the [employer] - can be eaten whole or used in a variety of dishes   Meats Protein - Limiting red meat: no more than a few times a month - When eating red meat: choose lean cuts and keep the portion to the size of deck of cards - Eggs: approx. 0 to 4 times a week  - Fish and lean poultry: at least 2 a week  - Healthy protein sources include, chicken, [place], lean beef, lamb - Increase intake of seafood such as tuna, salmon, trout, mackerel, shrimp, scallops - Avoid or limit high fat processed meats such as sausage and bacon  Dairy - Include moderate amounts of low fat dairy products  - Focus on healthy dairy such as fat free yogurt, skim milk, low or reduced fat cheese - Limit dairy products higher in fat such as whole or 2% milk, cheese, ice cream  Alcohol - Moderate amounts of red wine is ok  - No more than 5 oz daily for women (all ages) and men older than age 51  - No more than 10 oz of wine daily for men younger than 80  Other - Limit sweets and other desserts  - Use herbs and spices instead of salt to flavor foods  - Herbs and spices common to the traditional Mediterranean Diet include: basil, bay leaves, chives, cloves, cumin, fennel, garlic, lavender, marjoram, mint, oregano,  parsley, pepper, rosemary, sage, savory, sumac, tarragon, thyme   It's not just a diet, it's a lifestyle:  . The Mediterranean diet includes lifestyle factors typical of those in the region  . Foods, drinks and meals are best eaten with others and savored . Daily physical activity is important for overall good health . This could be strenuous exercise like running and aerobics . This could also be more leisurely activities such as walking, housework, yard-work, or taking the stairs . Moderation is the key; a balanced and healthy diet accommodates most foods and drinks . Consider portion sizes and frequency of consumption of certain foods   Meal Ideas & Options:  . Breakfast:  o Whole wheat toast or whole wheat English muffins with peanut butter & hard boiled egg o Steel cut oats topped with apples & cinnamon and skim milk  o Fresh fruit: banana, strawberries, melon, berries, peaches  o Smoothies: strawberries, bananas, greek yogurt, peanut butter o Low fat greek yogurt with blueberries and granola  o Egg white omelet with spinach and mushrooms o Breakfast couscous: whole wheat couscous, apricots, skim milk, cranberries  . Sandwiches:  o Hummus and grilled vegetables (peppers, zucchini, squash) on whole wheat bread   o Grilled chicken on whole wheat pita with lettuce, tomatoes, cucumbers or tzatziki  o Tuna salad on whole wheat bread: tuna salad made with greek yogurt, olives, red peppers, capers, green onions o Garlic rosemary lamb pita: lamb sauted with garlic, rosemary, salt & pepper; add lettuce, cucumber, greek yogurt to [employer] - flavor  with lemon juice and black pepper  . Seafood:  o Mediterranean grilled salmon, seasoned with garlic, basil, parsley, lemon juice and black pepper o Shrimp, lemon, and spinach whole-grain pasta salad made with low fat greek yogurt  o Seared scallops with lemon orzo  o Seared tuna steaks seasoned salt, pepper, coriander topped with tomato mixture of  olives, tomatoes, olive oil, minced garlic, parsley, green onions and cappers  . Meats:  o Herbed greek chicken salad with kalamata olives, cucumber, feta  o Red bell peppers stuffed with spinach, bulgur, lean ground beef (or lentils) & topped with feta   o Kebabs: skewers of chicken, tomatoes, onions, zucchini, squash  o [place] burgers: made with red onions, mint, dill, lemon juice, feta cheese topped with roasted red peppers . Vegetarian o Cucumber salad: cucumbers, artichoke hearts, celery, red onion, feta cheese, tossed in olive oil & lemon juice  o Hummus and whole grain pita points with a greek salad (lettuce, tomato, feta, olives, cucumbers, red onion) o Lentil soup with celery, carrots made with vegetable broth, garlic, salt and pepper  o Tabouli salad: parsley, bulgur, mint, scallions, cucumbers, tomato, radishes, lemon juice, olive oil, salt and pepper.

## 2015-08-12 ENCOUNTER — Ambulatory Visit (INDEPENDENT_AMBULATORY_CARE_PROVIDER_SITE_OTHER): Payer: BLUE CROSS/BLUE SHIELD | Admitting: Internal Medicine

## 2015-08-12 ENCOUNTER — Other Ambulatory Visit: Payer: Self-pay | Admitting: Internal Medicine

## 2015-08-12 ENCOUNTER — Encounter: Payer: Self-pay | Admitting: Internal Medicine

## 2015-08-12 VITALS — BP 100/72 | Temp 98.4°F | Ht 64.0 in | Wt 177.7 lb

## 2015-08-12 DIAGNOSIS — E785 Hyperlipidemia, unspecified: Secondary | ICD-10-CM

## 2015-08-12 DIAGNOSIS — Z23 Encounter for immunization: Secondary | ICD-10-CM | POA: Diagnosis not present

## 2015-08-12 DIAGNOSIS — R110 Nausea: Secondary | ICD-10-CM | POA: Insufficient documentation

## 2015-08-12 DIAGNOSIS — Z0000 Encounter for general adult medical examination without abnormal findings: Secondary | ICD-10-CM | POA: Diagnosis not present

## 2015-08-12 DIAGNOSIS — E039 Hypothyroidism, unspecified: Secondary | ICD-10-CM

## 2015-08-12 NOTE — Progress Notes (Signed)
Pre visit review using our clinic review tool, if applicable. No additional management support is needed unless otherwise documented below in the visit note.  Chief Complaint  Patient presents with  . Annual Exam    intermittent nausea  . Hypothyroidism    HPI: Patient  Lisa Bradford  44 y.o. comes in today for Preventive Health Care visit  And med management  iud rare bleeding  ocass nausea  August  Awoke with sx   No abd pain  No vomiting no bleeding  Seems to be after eating egg dishes Health Maintenance  Topic Date Due  . PAP SMEAR  07/08/2015  . HIV Screening  08/10/2016 (Originally 03/20/1986)  . INFLUENZA VACCINE  04/24/2016  . TETANUS/TDAP  04/01/2022   Health Maintenance Review LIFESTYLE:  Exercise:   ocass gym Tobacco/ETS: no  Alcohol:  Per month once  Sugar beverages: tea sweet sometimes  Diet sodas  Sleep:     About 8 hours  Drug use: no  ROS:  GEN/ HEENT: No fever, significant weight changes sweats headaches vision problems hearing changes, CV/ PULM; No chest pain shortness of breath cough, syncope,edema  change in exercise tolerance. GI /GU: No adominal pain, vomiting, change in bowel habits. No blood in the stool. No significant GU symptoms. SKIN/HEME: ,no acute skin rashes suspicious lesions or bleeding. No lymphadenopathy, nodules, masses.  NEURO/ PSYCH:  No neurologic signs such as weakness numbness. No depression anxiety. IMM/ Allergy: No unusual infections.  Allergy .   REST of 12 system review negative except as per HPI   Past Medical History  Diagnosis Date  . Allergy   . Heart murmur     echo in 98  . Hypothyroidism     pos antibodies  goiter     Past Surgical History  Procedure Laterality Date  . Cervical polypectomy      Family History  Problem Relation Age of Onset  . Cancer Mother     ovarian and cervical  . Cancer Maternal Grandmother     lung  . Cancer Maternal Grandfather     lung and pancreatic  . Coronary artery disease  Father     stent    Social History   Social History  . Marital Status: Single    Spouse Name: N/A  . Number of Children: N/A  . Years of Education: N/A   Social History Main Topics  . Smoking status: Never Smoker   . Smokeless tobacco: Never Used  . Alcohol Use: 0.0 oz/week    0 Standard drinks or equivalent per week     Comment: once a month  . Drug Use: No  . Sexual Activity: No   Other Topics Concern  . None   Social History Narrative   HH of 1   Pet cat.   ocass exercise .   No tob    rare etoh   caffiene  2 per day   Work  40 hours per week.    Good sleep.     Outpatient Prescriptions Prior to Visit  Medication Sig Dispense Refill  . fluticasone (FLONASE) 50 MCG/ACT nasal spray INSTILL 2 SPRAYS IN EACH NOSTRIL EVERY DAY AS NEEDED FOR ALLERGIES AND SINUSITIS 16 g 2  . levonorgestrel (MIRENA) 20 MCG/24HR IUD 1 each by Intrauterine route once.    Marland Kitchen levothyroxine (SYNTHROID, LEVOTHROID) 100 MCG tablet Take 1 tablet (100 mcg total) by mouth daily. 90 tablet 0  . azithromycin (ZITHROMAX) 250 MG tablet As directed 6  tablet 0  . HYDROcodone-homatropine (HYDROMET) 5-1.5 MG/5ML syrup Take 5 mLs by mouth every 4 (four) hours as needed. 240 mL 0   No facility-administered medications prior to visit.     EXAM:  BP 100/72 mmHg  Temp(Src) 98.4 F (36.9 C) (Oral)  Ht $R'5\' 4"'Te$  (1.626 m)  Wt 177 lb 11.2 oz (80.604 kg)  BMI 30.49 kg/m2  Body mass index is 30.49 kg/(m^2).  Physical Exam: Vital signs reviewed GNO:IBBC is a well-developed well-nourished alert cooperative    who appearsr stated age in no acute distress.  HEENT: normocephalic atraumatic , Eyes: PERRL EOM's full, conjunctiva clear, Nares: paten,t no deformity discharge or tenderness., Ears: no deformity EAC's clear TMs with normal landmarks. Mouth: clear OP, no lesions, edema.  Moist mucous membranes. Dentition in adequate repair. NECK: supple without masses, palp thyroid  or bruits.Breast: normal by inspection  . No dimpling, discharge, masses, tenderness or discharge . CHEST/PULM:  Clear to auscultation and percussion breath sounds equal no wheeze , rales or rhonchi. No chest wall deformities or tenderness. CV: PMI is nondisplaced, S1 S2 no gallops, murmurs, rubs. Peripheral pulses are full without delay.No JVD .  ABDOMEN: Bowel sounds normal nontender  No guard or rebound, no hepato splenomegal no CVA tenderness.  . Extremtities:  No clubbing cyanosis or edema, no acute joint swelling or redness no focal atrophy NEURO:  Oriented x3, cranial nerves 3-12 appear to be intact, no obvious focal weakness,gait within normal limits no abnormal reflexes or asymmetrical SKIN: No acute rashes normal turgor, color, no bruising or petechiae. PSYCH: Oriented, good eye contact, no obvious depression anxiety, cognition and judgment appear normal. LN: no cervical axillary inguinal adenopathy  Lab Results  Component Value Date   WBC 7.7 07/28/2015   HGB 13.0 07/28/2015   HCT 38.7 07/28/2015   PLT 186.0 07/28/2015   GLUCOSE 102* 07/28/2015   CHOL 169 07/28/2015   TRIG 225.0* 07/28/2015   HDL 38.80* 07/28/2015   LDLDIRECT 109.0 07/28/2015   LDLCALC 132* 11/03/2013   ALT 18 07/28/2015   AST 16 07/28/2015   NA 140 07/28/2015   K 4.2 07/28/2015   CL 104 07/28/2015   CREATININE 0.88 07/28/2015   BUN 15 07/28/2015   CO2 30 07/28/2015   TSH 0.50 07/28/2015    ASSESSMENT AND PLAN:  Discussed the following assessment and plan:  Visit for preventive health examination  Hypothyroidism, unspecified hypothyroidism type  Hyperlipidemia - w borderline bg  dysmetabolic  lsi   Nausea without vomiting - see text  intermittent not alarming fu as advised  Need for prophylactic vaccination and inoculation against influenza - Plan: Flu Vaccine QUAD 36+ mos PF IM (Fluarix & Fluzone Quad PF)  Patient Care Team: Burnis Medin, MD as PCP - General Everett Graff, MD (Obstetrics and Gynecology) Luberta Mutter, MD  (Ophthalmology) dermatologist Patient Instructions   lifestyle intervention healthy eating and exercise . Nausea can be from lots of causes .   If begin vomiting  Zantac  150 .  Twice a day for 3-4 weeks and then as needed   Avoid tums   To avoid acid rebound .  See what nausea is doing  But no alarm  At this time.    Health Maintenance, Female Adopting a healthy lifestyle and getting preventive care can go a long way to promote health and wellness. Talk with your health care provider about what schedule of regular examinations is right for you. This is a good chance for you to check  in with your provider about disease prevention and staying healthy. In between checkups, there are plenty of things you can do on your own. Experts have done a lot of research about which lifestyle changes and preventive measures are most likely to keep you healthy. Ask your health care provider for more information. WEIGHT AND DIET  Eat a healthy diet  Be sure to include plenty of vegetables, fruits, low-fat dairy products, and lean protein.  Do not eat a lot of foods high in solid fats, added sugars, or salt.  Get regular exercise. This is one of the most important things you can do for your health.  Most adults should exercise for at least 150 minutes each week. The exercise should increase your heart rate and make you sweat (moderate-intensity exercise).  Most adults should also do strengthening exercises at least twice a week. This is in addition to the moderate-intensity exercise.  Maintain a healthy weight  Body mass index (BMI) is a measurement that can be used to identify possible weight problems. It estimates body fat based on height and weight. Your health care provider can help determine your BMI and help you achieve or maintain a healthy weight.  For females 49 years of age and older:   A BMI below 18.5 is considered underweight.  A BMI of 18.5 to 24.9 is normal.  A BMI of 25 to 29.9 is  considered overweight.  A BMI of 30 and above is considered obese.  Watch levels of cholesterol and blood lipids  You should start having your blood tested for lipids and cholesterol at 45 years of age, then have this test every 5 years.  You may need to have your cholesterol levels checked more often if:  Your lipid or cholesterol levels are high.  You are older than 45 years of age.  You are at high risk for heart disease.  CANCER SCREENING   Lung Cancer  Lung cancer screening is recommended for adults 42-71 years old who are at high risk for lung cancer because of a history of smoking.  A yearly low-dose CT scan of the lungs is recommended for people who:  Currently smoke.  Have quit within the past 15 years.  Have at least a 30-pack-year history of smoking. A pack year is smoking an average of one pack of cigarettes a day for 1 year.  Yearly screening should continue until it has been 15 years since you quit.  Yearly screening should stop if you develop a health problem that would prevent you from having lung cancer treatment.  Breast Cancer  Practice breast self-awareness. This means understanding how your breasts normally appear and feel.  It also means doing regular breast self-exams. Let your health care provider know about any changes, no matter how small.  If you are in your 20s or 30s, you should have a clinical breast exam (CBE) by a health care provider every 1-3 years as part of a regular health exam.  If you are 60 or older, have a CBE every year. Also consider having a breast X-ray (mammogram) every year.  If you have a family history of breast cancer, talk to your health care provider about genetic screening.  If you are at high risk for breast cancer, talk to your health care provider about having an MRI and a mammogram every year.  Breast cancer gene (BRCA) assessment is recommended for women who have family members with BRCA-related cancers.  BRCA-related cancers include:  Breast.  Ovarian.  Tubal.  Peritoneal cancers.  Results of the assessment will determine the need for genetic counseling and BRCA1 and BRCA2 testing. Cervical Cancer Your health care provider may recommend that you be screened regularly for cancer of the pelvic organs (ovaries, uterus, and vagina). This screening involves a pelvic examination, including checking for microscopic changes to the surface of your cervix (Pap test). You may be encouraged to have this screening done every 3 years, beginning at age 6.  For women ages 70-65, health care providers may recommend pelvic exams and Pap testing every 3 years, or they may recommend the Pap and pelvic exam, combined with testing for human papilloma virus (HPV), every 5 years. Some types of HPV increase your risk of cervical cancer. Testing for HPV may also be done on women of any age with unclear Pap test results.  Other health care providers may not recommend any screening for nonpregnant women who are considered low risk for pelvic cancer and who do not have symptoms. Ask your health care provider if a screening pelvic exam is right for you.  If you have had past treatment for cervical cancer or a condition that could lead to cancer, you need Pap tests and screening for cancer for at least 20 years after your treatment. If Pap tests have been discontinued, your risk factors (such as having a new sexual partner) need to be reassessed to determine if screening should resume. Some women have medical problems that increase the chance of getting cervical cancer. In these cases, your health care provider may recommend more frequent screening and Pap tests. Colorectal Cancer  This type of cancer can be detected and often prevented.  Routine colorectal cancer screening usually begins at 45 years of age and continues through 45 years of age.  Your health care provider may recommend screening at an earlier age if you  have risk factors for colon cancer.  Your health care provider may also recommend using home test kits to check for hidden blood in the stool.  A small camera at the end of a tube can be used to examine your colon directly (sigmoidoscopy or colonoscopy). This is done to check for the earliest forms of colorectal cancer.  Routine screening usually begins at age 32.  Direct examination of the colon should be repeated every 5-10 years through 45 years of age. However, you may need to be screened more often if early forms of precancerous polyps or small growths are found. Skin Cancer  Check your skin from head to toe regularly.  Tell your health care provider about any new moles or changes in moles, especially if there is a change in a mole's shape or color.  Also tell your health care provider if you have a mole that is larger than the size of a pencil eraser.  Always use sunscreen. Apply sunscreen liberally and repeatedly throughout the day.  Protect yourself by wearing long sleeves, pants, a wide-brimmed hat, and sunglasses whenever you are outside. HEART DISEASE, DIABETES, AND HIGH BLOOD PRESSURE   High blood pressure causes heart disease and increases the risk of stroke. High blood pressure is more likely to develop in:  People who have blood pressure in the high end of the normal range (130-139/85-89 mm Hg).  People who are overweight or obese.  People who are African American.  If you are 3-79 years of age, have your blood pressure checked every 3-5 years. If you are 34 years of age or older, have  your blood pressure checked every year. You should have your blood pressure measured twice--once when you are at a hospital or clinic, and once when you are not at a hospital or clinic. Record the average of the two measurements. To check your blood pressure when you are not at a hospital or clinic, you can use:  An automated blood pressure machine at a pharmacy.  A home blood pressure  monitor.  If you are between 41 years and 16 years old, ask your health care provider if you should take aspirin to prevent strokes.  Have regular diabetes screenings. This involves taking a blood sample to check your fasting blood sugar level.  If you are at a normal weight and have a low risk for diabetes, have this test once every three years after 45 years of age.  If you are overweight and have a high risk for diabetes, consider being tested at a younger age or more often. PREVENTING INFECTION  Hepatitis B  If you have a higher risk for hepatitis B, you should be screened for this virus. You are considered at high risk for hepatitis B if:  You were born in a country where hepatitis B is common. Ask your health care provider which countries are considered high risk.  Your parents were born in a high-risk country, and you have not been immunized against hepatitis B (hepatitis B vaccine).  You have HIV or AIDS.  You use needles to inject street drugs.  You live with someone who has hepatitis B.  You have had sex with someone who has hepatitis B.  You get hemodialysis treatment.  You take certain medicines for conditions, including cancer, organ transplantation, and autoimmune conditions. Hepatitis C  Blood testing is recommended for:  Everyone born from [birth year] through 1966.  Anyone with known risk factors for hepatitis C. Sexually transmitted infections (STIs)  You should be screened for sexually transmitted infections (STIs) including gonorrhea and chlamydia if:  You are sexually active and are younger than 46 years of age.  You are older than 45 years of age and your health care provider tells you that you are at risk for this type of infection.  Your sexual activity has changed since you were last screened and you are at an increased risk for chlamydia or gonorrhea. Ask your health care provider if you are at risk.  If you do not have HIV, but are at risk, it may be  recommended that you take a prescription medicine daily to prevent HIV infection. This is called pre-exposure prophylaxis (PrEP). You are considered at risk if:  You are sexually active and do not regularly use condoms or know the HIV status of your partner(s).  You take drugs by injection.  You are sexually active with a partner who has HIV. Talk with your health care provider about whether you are at high risk of being infected with HIV. If you choose to begin PrEP, you should first be tested for HIV. You should then be tested every 3 months for as long as you are taking PrEP.  PREGNANCY   If you are premenopausal and you may become pregnant, ask your health care provider about preconception counseling.  If you may become pregnant, take 400 to 800 micrograms (mcg) of folic acid every day.  If you want to prevent pregnancy, talk to your health care provider about birth control (contraception). OSTEOPOROSIS AND MENOPAUSE   Osteoporosis is a disease in which the bones lose minerals and  strength with aging. This can result in serious bone fractures. Your risk for osteoporosis can be identified using a bone density scan.  If you are 80 years of age or older, or if you are at risk for osteoporosis and fractures, ask your health care provider if you should be screened.  Ask your health care provider whether you should take a calcium or vitamin D supplement to lower your risk for osteoporosis.  Menopause may have certain physical symptoms and risks.  Hormone replacement therapy may reduce some of these symptoms and risks. Talk to your health care provider about whether hormone replacement therapy is right for you.  HOME CARE INSTRUCTIONS   Schedule regular health, dental, and eye exams.  Stay current with your immunizations.   Do not use any tobacco products including cigarettes, chewing tobacco, or electronic cigarettes.  If you are pregnant, do not drink alcohol.  If you are  breastfeeding, limit how much and how often you drink alcohol.  Limit alcohol intake to no more than 1 drink per day for nonpregnant women. One drink equals 12 ounces of beer, 5 ounces of wine, or 1 ounces of hard liquor.  Do not use street drugs.  Do not share needles.  Ask your health care provider for help if you need support or information about quitting drugs.  Tell your health care provider if you often feel depressed.  Tell your health care provider if you have ever been abused or do not feel safe at home.   This information is not intended to replace advice given to you by your health care provider. Make sure you discuss any questions you have with your health care provider.   Document Released: 03/26/2011 Document Revised: 10/01/2014 Document Reviewed: 08/12/2013 Elsevier Interactive Patient Education [employer].     Why follow it? Research shows. . Those who follow the Mediterranean diet have a reduced risk of heart disease  . The diet is associated with a reduced incidence of Parkinson's and Alzheimer's diseases . People following the diet may have longer life expectancies and lower rates of chronic diseases  . The Dietary Guidelines for Americans recommends the Mediterranean diet as an eating plan to promote health and prevent disease  What Is the Mediterranean Diet?  . Healthy eating plan based on typical foods and recipes of Mediterranean-style cooking . The diet is primarily a plant based diet; these foods should make up a majority of meals   Starches - Plant based foods should make up a majority of meals - They are an important sources of vitamins, minerals, energy, antioxidants, and fiber - Choose whole grains, foods high in fiber and minimally processed items  - Typical grain sources include wheat, oats, barley, corn, brown rice, bulgar, farro, millet, polenta, couscous  - Various types of beans include chickpeas, lentils, fava beans, black beans, white  beans   Fruits  Veggies - Large quantities of antioxidant rich fruits & veggies; 6 or more servings  - Vegetables can be eaten raw or lightly drizzled with oil and cooked  - Vegetables common to the traditional Mediterranean Diet include: artichokes, arugula, beets, broccoli, brussel sprouts, cabbage, carrots, celery, collard greens, cucumbers, eggplant, kale, leeks, lemons, lettuce, mushrooms, okra, onions, peas, peppers, potatoes, pumpkin, radishes, rutabaga, shallots, spinach, sweet potatoes, turnips, zucchini - Fruits common to the Mediterranean Diet include: apples, apricots, avocados, cherries, clementines, dates, figs, grapefruits, grapes, melons, nectarines, oranges, peaches, pears, pomegranates, strawberries, tangerines  Fats - Replace butter and margarine with healthy  oils, such as olive oil, canola oil, and tahini  - Limit nuts to no more than a handful a day  - Nuts include walnuts, almonds, pecans, pistachios, pine nuts  - Limit or avoid candied, honey roasted or heavily salted nuts - Olives are central to the Mediterranean diet - can be eaten whole or used in a variety of dishes   Meats Protein - Limiting red meat: no more than a few times a month - When eating red meat: choose lean cuts and keep the portion to the size of deck of cards - Eggs: approx. 0 to 4 times a week  - Fish and lean poultry: at least 2 a week  - Healthy protein sources include, chicken, [place], lean beef, lamb - Increase intake of seafood such as tuna, salmon, trout, mackerel, shrimp, scallops - Avoid or limit high fat processed meats such as sausage and bacon  Dairy - Include moderate amounts of low fat dairy products  - Focus on healthy dairy such as fat free yogurt, skim milk, low or reduced fat cheese - Limit dairy products higher in fat such as whole or 2% milk, cheese, ice cream  Alcohol - Moderate amounts of red wine is ok  - No more than 5 oz daily for women (all ages) and men older than age 1  - No  more than 10 oz of wine daily for men younger than 47  Other - Limit sweets and other desserts  - Use herbs and spices instead of salt to flavor foods  - Herbs and spices common to the traditional Mediterranean Diet include: basil, bay leaves, chives, cloves, cumin, fennel, garlic, lavender, marjoram, mint, oregano, parsley, pepper, rosemary, sage, savory, sumac, tarragon, thyme   It's not just a diet, it's a lifestyle:  . The Mediterranean diet includes lifestyle factors typical of those in the region  . Foods, drinks and meals are best eaten with others and savored . Daily physical activity is important for overall good health . This could be strenuous exercise like running and aerobics . This could also be more leisurely activities such as walking, housework, yard-work, or taking the stairs . Moderation is the key; a balanced and healthy diet accommodates most foods and drinks . Consider portion sizes and frequency of consumption of certain foods   Meal Ideas & Options:  . Breakfast:  o Whole wheat toast or whole wheat English muffins with peanut butter & hard boiled egg o Steel cut oats topped with apples & cinnamon and skim milk  o Fresh fruit: banana, strawberries, melon, berries, peaches  o Smoothies: strawberries, bananas, greek yogurt, peanut butter o Low fat greek yogurt with blueberries and granola  o Egg white omelet with spinach and mushrooms o Breakfast couscous: whole wheat couscous, apricots, skim milk, cranberries  . Sandwiches:  o Hummus and grilled vegetables (peppers, zucchini, squash) on whole wheat bread   o Grilled chicken on whole wheat pita with lettuce, tomatoes, cucumbers or tzatziki  o Tuna salad on whole wheat bread: tuna salad made with greek yogurt, olives, red peppers, capers, green onions o Garlic rosemary lamb pita: lamb sauted with garlic, rosemary, salt & pepper; add lettuce, cucumber, greek yogurt to pita - flavor with lemon juice and black pepper   . Seafood:  o Mediterranean grilled salmon, seasoned with garlic, basil, parsley, lemon juice and black pepper o Shrimp, lemon, and spinach whole-grain pasta salad made with low fat greek yogurt  o Seared scallops with lemon orzo  o Seared tuna steaks seasoned salt, pepper, coriander topped with tomato mixture of olives, tomatoes, olive oil, minced garlic, parsley, green onions and cappers  . Meats:  o Herbed greek chicken salad with kalamata olives, cucumber, feta  o Red bell peppers stuffed with spinach, bulgur, lean ground beef (or lentils) & topped with feta   o Kebabs: skewers of chicken, tomatoes, onions, zucchini, squash  o [place] burgers: made with red onions, mint, dill, lemon juice, feta cheese topped with roasted red peppers . Vegetarian o Cucumber salad: cucumbers, artichoke hearts, celery, red onion, feta cheese, tossed in olive oil & lemon juice  o Hummus and whole grain pita points with a greek salad (lettuce, tomato, feta, olives, cucumbers, red onion) o Lentil soup with celery, carrots made with vegetable broth, garlic, salt and pepper  o Tabouli salad: parsley, bulgur, mint, scallions, cucumbers, tomato, radishes, lemon juice, olive oil, salt and pepper.         Standley Brooking. Rhylee Nunn M.D.

## 2015-08-12 NOTE — Patient Instructions (Addendum)
lifestyle intervention healthy eating and exercise . Nausea can be from lots of causes .   If begin vomiting  Zantac  150 .  Twice a day for 3-4 weeks and then as needed   Avoid tums   To avoid acid rebound .  See what nausea is doing  But no alarm  At this time.    Health Maintenance, Female Adopting a healthy lifestyle and getting preventive care can go a long way to promote health and wellness. Talk with your health care provider about what schedule of regular examinations is right for you. This is a good chance for you to check in with your provider about disease prevention and staying healthy. In between checkups, there are plenty of things you can do on your own. Experts have done a lot of research about which lifestyle changes and preventive measures are most likely to keep you healthy. Ask your health care provider for more information. WEIGHT AND DIET  Eat a healthy diet  Be sure to include plenty of vegetables, fruits, low-fat dairy products, and lean protein.  Do not eat a lot of foods high in solid fats, added sugars, or salt.  Get regular exercise. This is one of the most important things you can do for your health.  Most adults should exercise for at least 150 minutes each week. The exercise should increase your heart rate and make you sweat (moderate-intensity exercise).  Most adults should also do strengthening exercises at least twice a week. This is in addition to the moderate-intensity exercise.  Maintain a healthy weight  Body mass index (BMI) is a measurement that can be used to identify possible weight problems. It estimates body fat based on height and weight. Your health care provider can help determine your BMI and help you achieve or maintain a healthy weight.  For females 62 years of age and older:   A BMI below 18.5 is considered underweight.  A BMI of 18.5 to 24.9 is normal.  A BMI of 25 to 29.9 is considered overweight.  A BMI of 30 and above is  considered obese.  Watch levels of cholesterol and blood lipids  You should start having your blood tested for lipids and cholesterol at 45 years of age, then have this test every 5 years.  You may need to have your cholesterol levels checked more often if:  Your lipid or cholesterol levels are high.  You are older than 45 years of age.  You are at high risk for heart disease.  CANCER SCREENING   Lung Cancer  Lung cancer screening is recommended for adults 27-17 years old who are at high risk for lung cancer because of a history of smoking.  A yearly low-dose CT scan of the lungs is recommended for people who:  Currently smoke.  Have quit within the past 15 years.  Have at least a 30-pack-year history of smoking. A pack year is smoking an average of one pack of cigarettes a day for 1 year.  Yearly screening should continue until it has been 15 years since you quit.  Yearly screening should stop if you develop a health problem that would prevent you from having lung cancer treatment.  Breast Cancer  Practice breast self-awareness. This means understanding how your breasts normally appear and feel.  It also means doing regular breast self-exams. Let your health care provider know about any changes, no matter how small.  If you are in your 20s or 30s, you should  have a clinical breast exam (CBE) by a health care provider every 1-3 years as part of a regular health exam.  If you are 59 or older, have a CBE every year. Also consider having a breast X-ray (mammogram) every year.  If you have a family history of breast cancer, talk to your health care provider about genetic screening.  If you are at high risk for breast cancer, talk to your health care provider about having an MRI and a mammogram every year.  Breast cancer gene (BRCA) assessment is recommended for women who have family members with BRCA-related cancers. BRCA-related cancers  include:  Breast.  Ovarian.  Tubal.  Peritoneal cancers.  Results of the assessment will determine the need for genetic counseling and BRCA1 and BRCA2 testing. Cervical Cancer Your health care provider may recommend that you be screened regularly for cancer of the pelvic organs (ovaries, uterus, and vagina). This screening involves a pelvic examination, including checking for microscopic changes to the surface of your cervix (Pap test). You may be encouraged to have this screening done every 3 years, beginning at age 30.  For women ages 50-65, health care providers may recommend pelvic exams and Pap testing every 3 years, or they may recommend the Pap and pelvic exam, combined with testing for human papilloma virus (HPV), every 5 years. Some types of HPV increase your risk of cervical cancer. Testing for HPV may also be done on women of any age with unclear Pap test results.  Other health care providers may not recommend any screening for nonpregnant women who are considered low risk for pelvic cancer and who do not have symptoms. Ask your health care provider if a screening pelvic exam is right for you.  If you have had past treatment for cervical cancer or a condition that could lead to cancer, you need Pap tests and screening for cancer for at least 20 years after your treatment. If Pap tests have been discontinued, your risk factors (such as having a new sexual partner) need to be reassessed to determine if screening should resume. Some women have medical problems that increase the chance of getting cervical cancer. In these cases, your health care provider may recommend more frequent screening and Pap tests. Colorectal Cancer  This type of cancer can be detected and often prevented.  Routine colorectal cancer screening usually begins at 45 years of age and continues through 44 years of age.  Your health care provider may recommend screening at an earlier age if you have risk factors for  colon cancer.  Your health care provider may also recommend using home test kits to check for hidden blood in the stool.  A small camera at the end of a tube can be used to examine your colon directly (sigmoidoscopy or colonoscopy). This is done to check for the earliest forms of colorectal cancer.  Routine screening usually begins at age 55.  Direct examination of the colon should be repeated every 5-10 years through 45 years of age. However, you may need to be screened more often if early forms of precancerous polyps or small growths are found. Skin Cancer  Check your skin from head to toe regularly.  Tell your health care provider about any new moles or changes in moles, especially if there is a change in a mole's shape or color.  Also tell your health care provider if you have a mole that is larger than the size of a pencil eraser.  Always use sunscreen. Apply  sunscreen liberally and repeatedly throughout the day.  Protect yourself by wearing long sleeves, pants, a wide-brimmed hat, and sunglasses whenever you are outside. HEART DISEASE, DIABETES, AND HIGH BLOOD PRESSURE   High blood pressure causes heart disease and increases the risk of stroke. High blood pressure is more likely to develop in:  People who have blood pressure in the high end of the normal range (130-139/85-89 mm Hg).  People who are overweight or obese.  People who are African American.  If you are 10-33 years of age, have your blood pressure checked every 3-5 years. If you are 58 years of age or older, have your blood pressure checked every year. You should have your blood pressure measured twice--once when you are at a hospital or clinic, and once when you are not at a hospital or clinic. Record the average of the two measurements. To check your blood pressure when you are not at a hospital or clinic, you can use:  An automated blood pressure machine at a pharmacy.  A home blood pressure monitor.  If you  are between 73 years and 22 years old, ask your health care provider if you should take aspirin to prevent strokes.  Have regular diabetes screenings. This involves taking a blood sample to check your fasting blood sugar level.  If you are at a normal weight and have a low risk for diabetes, have this test once every three years after 45 years of age.  If you are overweight and have a high risk for diabetes, consider being tested at a younger age or more often. PREVENTING INFECTION  Hepatitis B  If you have a higher risk for hepatitis B, you should be screened for this virus. You are considered at high risk for hepatitis B if:  You were born in a country where hepatitis B is common. Ask your health care provider which countries are considered high risk.  Your parents were born in a high-risk country, and you have not been immunized against hepatitis B (hepatitis B vaccine).  You have HIV or AIDS.  You use needles to inject street drugs.  You live with someone who has hepatitis B.  You have had sex with someone who has hepatitis B.  You get hemodialysis treatment.  You take certain medicines for conditions, including cancer, organ transplantation, and autoimmune conditions. Hepatitis C  Blood testing is recommended for:  Everyone born from [birth year] through 1966.  Anyone with known risk factors for hepatitis C. Sexually transmitted infections (STIs)  You should be screened for sexually transmitted infections (STIs) including gonorrhea and chlamydia if:  You are sexually active and are younger than 45 years of age.  You are older than 45 years of age and your health care provider tells you that you are at risk for this type of infection.  Your sexual activity has changed since you were last screened and you are at an increased risk for chlamydia or gonorrhea. Ask your health care provider if you are at risk.  If you do not have HIV, but are at risk, it may be recommended that you  take a prescription medicine daily to prevent HIV infection. This is called pre-exposure prophylaxis (PrEP). You are considered at risk if:  You are sexually active and do not regularly use condoms or know the HIV status of your partner(s).  You take drugs by injection.  You are sexually active with a partner who has HIV. Talk with your health care provider about whether  you are at high risk of being infected with HIV. If you choose to begin PrEP, you should first be tested for HIV. You should then be tested every 3 months for as long as you are taking PrEP.  PREGNANCY   If you are premenopausal and you may become pregnant, ask your health care provider about preconception counseling.  If you may become pregnant, take 400 to 800 micrograms (mcg) of folic acid every day.  If you want to prevent pregnancy, talk to your health care provider about birth control (contraception). OSTEOPOROSIS AND MENOPAUSE   Osteoporosis is a disease in which the bones lose minerals and strength with aging. This can result in serious bone fractures. Your risk for osteoporosis can be identified using a bone density scan.  If you are 40 years of age or older, or if you are at risk for osteoporosis and fractures, ask your health care provider if you should be screened.  Ask your health care provider whether you should take a calcium or vitamin D supplement to lower your risk for osteoporosis.  Menopause may have certain physical symptoms and risks.  Hormone replacement therapy may reduce some of these symptoms and risks. Talk to your health care provider about whether hormone replacement therapy is right for you.  HOME CARE INSTRUCTIONS   Schedule regular health, dental, and eye exams.  Stay current with your immunizations.   Do not use any tobacco products including cigarettes, chewing tobacco, or electronic cigarettes.  If you are pregnant, do not drink alcohol.  If you are breastfeeding, limit how  much and how often you drink alcohol.  Limit alcohol intake to no more than 1 drink per day for nonpregnant women. One drink equals 12 ounces of beer, 5 ounces of wine, or 1 ounces of hard liquor.  Do not use street drugs.  Do not share needles.  Ask your health care provider for help if you need support or information about quitting drugs.  Tell your health care provider if you often feel depressed.  Tell your health care provider if you have ever been abused or do not feel safe at home.   This information is not intended to replace advice given to you by your health care provider. Make sure you discuss any questions you have with your health care provider.   Document Released: 03/26/2011 Document Revised: 10/01/2014 Document Reviewed: 08/12/2013 Elsevier Interactive Patient Education [employer].     Why follow it? Research shows. . Those who follow the Mediterranean diet have a reduced risk of heart disease  . The diet is associated with a reduced incidence of Parkinson's and Alzheimer's diseases . People following the diet may have longer life expectancies and lower rates of chronic diseases  . The Dietary Guidelines for Americans recommends the Mediterranean diet as an eating plan to promote health and prevent disease  What Is the Mediterranean Diet?  . Healthy eating plan based on typical foods and recipes of Mediterranean-style cooking . The diet is primarily a plant based diet; these foods should make up a majority of meals   Starches - Plant based foods should make up a majority of meals - They are an important sources of vitamins, minerals, energy, antioxidants, and fiber - Choose whole grains, foods high in fiber and minimally processed items  - Typical grain sources include wheat, oats, barley, corn, brown rice, bulgar, farro, millet, polenta, couscous  - Various types of beans include chickpeas, lentils, fava beans, black beans, white  beans   Fruits  Veggies -  Large quantities of antioxidant rich fruits & veggies; 6 or more servings  - Vegetables can be eaten raw or lightly drizzled with oil and cooked  - Vegetables common to the traditional Mediterranean Diet include: artichokes, arugula, beets, broccoli, brussel sprouts, cabbage, carrots, celery, collard greens, cucumbers, eggplant, kale, leeks, lemons, lettuce, mushrooms, okra, onions, peas, peppers, potatoes, pumpkin, radishes, rutabaga, shallots, spinach, sweet potatoes, turnips, zucchini - Fruits common to the Mediterranean Diet include: apples, apricots, avocados, cherries, clementines, dates, figs, grapefruits, grapes, melons, nectarines, oranges, peaches, pears, pomegranates, strawberries, tangerines  Fats - Replace butter and margarine with healthy oils, such as olive oil, canola oil, and tahini  - Limit nuts to no more than a handful a day  - Nuts include walnuts, almonds, pecans, pistachios, pine nuts  - Limit or avoid candied, honey roasted or heavily salted nuts - Olives are central to the [employer] - can be eaten whole or used in a variety of dishes   Meats Protein - Limiting red meat: no more than a few times a month - When eating red meat: choose lean cuts and keep the portion to the size of deck of cards - Eggs: approx. 0 to 4 times a week  - Fish and lean poultry: at least 2 a week  - Healthy protein sources include, chicken, [place], lean beef, lamb - Increase intake of seafood such as tuna, salmon, trout, mackerel, shrimp, scallops - Avoid or limit high fat processed meats such as sausage and bacon  Dairy - Include moderate amounts of low fat dairy products  - Focus on healthy dairy such as fat free yogurt, skim milk, low or reduced fat cheese - Limit dairy products higher in fat such as whole or 2% milk, cheese, ice cream  Alcohol - Moderate amounts of red wine is ok  - No more than 5 oz daily for women (all ages) and men older than age 34  - No more than 10 oz of wine  daily for men younger than 65  Other - Limit sweets and other desserts  - Use herbs and spices instead of salt to flavor foods  - Herbs and spices common to the traditional Mediterranean Diet include: basil, bay leaves, chives, cloves, cumin, fennel, garlic, lavender, marjoram, mint, oregano, parsley, pepper, rosemary, sage, savory, sumac, tarragon, thyme   It's not just a diet, it's a lifestyle:  . The Mediterranean diet includes lifestyle factors typical of those in the region  . Foods, drinks and meals are best eaten with others and savored . Daily physical activity is important for overall good health . This could be strenuous exercise like running and aerobics . This could also be more leisurely activities such as walking, housework, yard-work, or taking the stairs . Moderation is the key; a balanced and healthy diet accommodates most foods and drinks . Consider portion sizes and frequency of consumption of certain foods   Meal Ideas & Options:  . Breakfast:  o Whole wheat toast or whole wheat English muffins with peanut butter & hard boiled egg o Steel cut oats topped with apples & cinnamon and skim milk  o Fresh fruit: banana, strawberries, melon, berries, peaches  o Smoothies: strawberries, bananas, greek yogurt, peanut butter o Low fat greek yogurt with blueberries and granola  o Egg white omelet with spinach and mushrooms o Breakfast couscous: whole wheat couscous, apricots, skim milk, cranberries  . Sandwiches:  o Hummus and grilled vegetables (peppers,  zucchini, squash) on whole wheat bread   o Grilled chicken on whole wheat pita with lettuce, tomatoes, cucumbers or tzatziki  o Tuna salad on whole wheat bread: tuna salad made with greek yogurt, olives, red peppers, capers, green onions o Garlic rosemary lamb pita: lamb sauted with garlic, rosemary, salt & pepper; add lettuce, cucumber, greek yogurt to pita - flavor with lemon juice and black pepper  . Seafood:   o Mediterranean grilled salmon, seasoned with garlic, basil, parsley, lemon juice and black pepper o Shrimp, lemon, and spinach whole-grain pasta salad made with low fat greek yogurt  o Seared scallops with lemon orzo  o Seared tuna steaks seasoned salt, pepper, coriander topped with tomato mixture of olives, tomatoes, olive oil, minced garlic, parsley, green onions and cappers  . Meats:  o Herbed greek chicken salad with kalamata olives, cucumber, feta  o Red bell peppers stuffed with spinach, bulgur, lean ground beef (or lentils) & topped with feta   o Kebabs: skewers of chicken, tomatoes, onions, zucchini, squash  o [place] burgers: made with red onions, mint, dill, lemon juice, feta cheese topped with roasted red peppers . Vegetarian o Cucumber salad: cucumbers, artichoke hearts, celery, red onion, feta cheese, tossed in olive oil & lemon juice  o Hummus and whole grain pita points with a greek salad (lettuce, tomato, feta, olives, cucumbers, red onion) o Lentil soup with celery, carrots made with vegetable broth, garlic, salt and pepper  o Tabouli salad: parsley, bulgur, mint, scallions, cucumbers, tomato, radishes, lemon juice, olive oil, salt and pepper.

## 2015-09-27 ENCOUNTER — Telehealth: Payer: Self-pay | Admitting: Internal Medicine

## 2015-09-27 NOTE — Telephone Encounter (Signed)
PLEASE NOTE: All timestamps contained within this report are represented as [place]. CONFIDENTIALTY NOTICE: This fax transmission is intended only for the addressee. It contains information that is legally privileged, confidential or otherwise protected from use or disclosure. If you are not the intended recipient, you are strictly prohibited from reviewing, disclosing, copying using or disseminating any of this information or taking any action in reliance on or regarding this information. If you have received this fax in error, please notify us immediately by telephone so that we can arrange for its return to us. Phone: [phone], Toll-Free: [phone], Fax: [phone] Page: 1 of 1 Call Id: [id number] [facility] - Client TELEPHONE ADVICE RECORD [facility] Patient Name: Bjorn LoserRHONDA Thurmon DOB: 23-Sep-1971 Initial Comment Caller states she thinks she may have a sinus infection. Headache, nasal congestion, pain in eye area. She has been taking cold medication, not going away. This has been going on for 6 weeks. Nurse Assessment Nurse: Elijah Birkaldwell, RN, Lynda Date/Time (Eastern Time): 09/27/2015 11:08:54 AM Confirm and document reason for call. If symptomatic, describe symptoms. ---Caller states she thinks she may have a sinus infection. Headache, nasal congestion, pain in eye area. She has been taking cold medication, not going away. This has been going on for 6 weeks. Had a fever initially when her symptoms started in November. Has a cough, sometimes productive. Has done nasal washes. Has the patient traveled out of the country within the last 30 days? ---Not Applicable Does the patient have any new or worsening symptoms? ---Yes Will a triage be completed? ---Yes Related visit to physician within the last 2 weeks? ---No Does the PT have any chronic conditions? (i.e. diabetes, asthma, etc.) ---Yes List chronic conditions.  ---hypothyroid Did the patient indicate they were pregnant? ---No Is this a behavioral health or substance abuse call? ---No Guidelines Guideline Title Affirmed Question Affirmed Notes Sinus Pain or Congestion Earache Final Disposition User See Physician within 24 Hours Charlotte Parkaldwell, RN, Lynda Referrals REFERRED TO PCP OFFICE Disagree/Comply: Comply

## 2015-09-27 NOTE — Telephone Encounter (Signed)
Patient has an appointment with Dr Fabian SharpPanosh 09/27/14

## 2015-09-27 NOTE — Telephone Encounter (Signed)
Error patient's appointment is 09/28/15

## 2015-09-28 ENCOUNTER — Encounter: Payer: Self-pay | Admitting: Internal Medicine

## 2015-09-28 ENCOUNTER — Ambulatory Visit (INDEPENDENT_AMBULATORY_CARE_PROVIDER_SITE_OTHER): Payer: BLUE CROSS/BLUE SHIELD | Admitting: Internal Medicine

## 2015-09-28 VITALS — BP 114/78 | Temp 98.6°F | Wt 181.6 lb

## 2015-09-28 DIAGNOSIS — H6502 Acute serous otitis media, left ear: Secondary | ICD-10-CM

## 2015-09-28 DIAGNOSIS — J069 Acute upper respiratory infection, unspecified: Secondary | ICD-10-CM | POA: Diagnosis not present

## 2015-09-28 DIAGNOSIS — J019 Acute sinusitis, unspecified: Secondary | ICD-10-CM | POA: Diagnosis not present

## 2015-09-28 MED ORDER — AMOXICILLIN-POT CLAVULANATE 875-125 MG PO TABS
1.0000 | ORAL_TABLET | Freq: Two times a day (BID) | ORAL | Status: DC
Start: 2015-09-28 — End: 2016-08-15

## 2015-09-28 NOTE — Progress Notes (Signed)
Pre visit review using our clinic review tool, if applicable. No additional management support is needed unless otherwise documented below in the visit note.  Chief Complaint  Patient presents with  . Sinus Pressure/Pain    Started Nov 28th. Fever and chills the first week.  Not since.  [facility]  Marland Kitchen Headache  . Nasal Congestion  . Ear Pain    HPI: Patient Lisa Bradford  comes in today for SDA for  new problem evaluation.  Onset  With cough  Like bronchitis  .    And a virus" needing to run  a course upt to 2 weeks" . And then improved  But never went away  Cough week 3 and then came back getting worse congestion  Head and cough  Cold medication  mucinex and coridicin   Nasal and psot nasal drainage  face pressure and ear pain .   No fever chills  .   Throat.   Some face pain and sinus pain .  ROS: See pertinent positives and negatives per HPI. No henoptysis  Sob    Past Medical History  Diagnosis Date  . Allergy   . Heart murmur     echo in 98  . Hypothyroidism     pos antibodies  goiter     Family History  Problem Relation Age of Onset  . Cancer Mother     ovarian and cervical  . Cancer Maternal Grandmother     lung  . Cancer Maternal Grandfather     lung and pancreatic  . Coronary artery disease Father     stent    Social History   Social History  . Marital Status: Single    Spouse Name: N/A  . Number of Children: N/A  . Years of Education: N/A   Social History Main Topics  . Smoking status: Never Smoker   . Smokeless tobacco: Never Used  . Alcohol Use: 0.0 oz/week    0 Standard drinks or equivalent per week     Comment: once a month  . Drug Use: No  . Sexual Activity: No   Other Topics Concern  . None   Social History Narrative   HH of 1   Pet cat.   ocass exercise .   No tob    rare etoh   caffiene  2 per day   Work  40 hours per week.    Good sleep.     Outpatient Prescriptions Prior to Visit  Medication  Sig Dispense Refill  . fluticasone (FLONASE) 50 MCG/ACT nasal spray INSTILL 2 SPRAYS IN EACH NOSTRIL EVERY DAY AS NEEDED FOR ALLERGIES AND SINUSITIS 16 g 2  . levonorgestrel (MIRENA) 20 MCG/24HR IUD 1 each by Intrauterine route once.    Marland Kitchen levothyroxine (SYNTHROID, LEVOTHROID) 100 MCG tablet TAKE 1 TABLET BY MOUTH DAILY 90 tablet 1   No facility-administered medications prior to visit.     EXAM:  BP 114/78 mmHg  Temp(Src) 98.6 F (37 C) (Oral)  Wt 181 lb 9.6 oz (82.373 kg)  Body mass index is 31.16 kg/(m^2). WDWN in NAD  quiet respirations; mod  congested  somewhat hoarse. Non toxic . HEENT: Normocephalic ;atraumatic , Eyes;  PERRL, EOMs  Full, lids and conjunctiva clear,,Ears: no deformities, canals nl, TM landmarks normal, but left ear with serous fluid nl bony lm  Nose: no deformity or discharge but very congested;face minimally tender Mouth : OP clear without lesion or edema .mild redness  Neck: Supple without adenopathy or masses or bruits Chest:  Clear to A without wheezes rales or rhonchi CV:  S1-S2 no gallops or murmurs peripheral perfusion is normal Skin :nl perfusion and no acute rashes   ASSESSMENT AND PLAN:  Discussed the following assessment and plan:  Acute sinusitis, recurrence not specified, unspecified location  Acute serous otitis media of left ear, recurrence not specified  Protracted URI   Expectant management. Antibiotic other measures . -Patient advised to return or notify health care team  if symptoms worsen ,persist or new concerns arise.  Patient Instructions  Relapsing symptoms  Need treatment for sinus infection bacterial.    Add antibiotic   And continue saline other  Comfort measures .   Sinusitis, Adult Sinusitis is redness, soreness, and inflammation of the paranasal sinuses. Paranasal sinuses are air pockets within the bones of your face. They are located beneath your eyes, in the middle of your forehead, and above your eyes. In healthy  paranasal sinuses, mucus is able to drain out, and air is able to circulate through them by way of your nose. However, when your paranasal sinuses are inflamed, mucus and air can become trapped. This can allow bacteria and other germs to grow and cause infection. Sinusitis can develop quickly and last only a short time (acute) or continue over a long period (chronic). Sinusitis that lasts for more than 12 weeks is considered chronic. CAUSES Causes of sinusitis include:  Allergies.  Structural abnormalities, such as displacement of the cartilage that separates your nostrils (deviated septum), which can decrease the air flow through your nose and sinuses and affect sinus drainage.  Functional abnormalities, such as when the small hairs (cilia) that line your sinuses and help remove mucus do not work properly or are not present. SIGNS AND SYMPTOMS Symptoms of acute and chronic sinusitis are the same. The primary symptoms are pain and pressure around the affected sinuses. Other symptoms include:  Upper toothache.  Earache.  Headache.  Bad breath.  Decreased sense of smell and taste.  A cough, which worsens when you are lying flat.  Fatigue.  Fever.  Thick drainage from your nose, which often is green and may contain pus (purulent).  Swelling and warmth over the affected sinuses. DIAGNOSIS Your health care provider will perform a physical exam. During your exam, your health care provider may perform any of the following to help determine if you have acute sinusitis or chronic sinusitis:  Look in your nose for signs of abnormal growths in your nostrils (nasal polyps).  Tap over the affected sinus to check for signs of infection.  View the inside of your sinuses using an imaging device that has a light attached (endoscope). If your health care provider suspects that you have chronic sinusitis, one or more of the following tests may be recommended:  Allergy tests.  Nasal culture.  A sample of mucus is taken from your nose, sent to a lab, and screened for bacteria.  Nasal cytology. A sample of mucus is taken from your nose and examined by your health care provider to determine if your sinusitis is related to an allergy. TREATMENT Most cases of acute sinusitis are related to a viral infection and will resolve on their own within 10 days. Sometimes, medicines are prescribed to help relieve symptoms of both acute and chronic sinusitis. These may include pain medicines, decongestants, nasal steroid sprays, or saline sprays. However, for sinusitis related to a bacterial infection, your health care provider will prescribe antibiotic  medicines. These are medicines that will help kill the bacteria causing the infection. Rarely, sinusitis is caused by a fungal infection. In these cases, your health care provider will prescribe antifungal medicine. For some cases of chronic sinusitis, surgery is needed. Generally, these are cases in which sinusitis recurs more than 3 times per year, despite other treatments. HOME CARE INSTRUCTIONS  Drink plenty of water. Water helps thin the mucus so your sinuses can drain more easily.  Use a humidifier.  Inhale steam 3-4 times a day (for example, sit in the bathroom with the shower running).  Apply a warm, moist washcloth to your face 3-4 times a day, or as directed by your health care provider.  Use saline nasal sprays to help moisten and clean your sinuses.  Take medicines only as directed by your health care provider.  If you were prescribed either an antibiotic or antifungal medicine, finish it all even if you start to feel better. SEEK IMMEDIATE MEDICAL CARE IF:  You have increasing pain or severe headaches.  You have nausea, vomiting, or drowsiness.  You have swelling around your face.  You have vision problems.  You have a stiff neck.  You have difficulty breathing.   This information is not intended to replace advice given  to you by your health care provider. Make sure you discuss any questions you have with your health care provider.   Document Released: 09/10/2005 Document Revised: 10/01/2014 Document Reviewed: 09/25/2011 Elsevier Interactive Patient Education 2016 [employer]      De[place]ordieu ColonyWanda K. Kolson Chovanec M.D.

## 2015-09-28 NOTE — Patient Instructions (Signed)
Relapsing symptoms  Need treatment for sinus infection bacterial.    Add antibiotic   And continue saline other  Comfort measures .   Sinusitis, Adult Sinusitis is redness, soreness, and inflammation of the paranasal sinuses. Paranasal sinuses are air pockets within the bones of your face. They are located beneath your eyes, in the middle of your forehead, and above your eyes. In healthy paranasal sinuses, mucus is able to drain out, and air is able to circulate through them by way of your nose. However, when your paranasal sinuses are inflamed, mucus and air can become trapped. This can allow bacteria and other germs to grow and cause infection. Sinusitis can develop quickly and last only a short time (acute) or continue over a long period (chronic). Sinusitis that lasts for more than 12 weeks is considered chronic. CAUSES Causes of sinusitis include:  Allergies.  Structural abnormalities, such as displacement of the cartilage that separates your nostrils (deviated septum), which can decrease the air flow through your nose and sinuses and affect sinus drainage.  Functional abnormalities, such as when the small hairs (cilia) that line your sinuses and help remove mucus do not work properly or are not present. SIGNS AND SYMPTOMS Symptoms of acute and chronic sinusitis are the same. The primary symptoms are pain and pressure around the affected sinuses. Other symptoms include:  Upper toothache.  Earache.  Headache.  Bad breath.  Decreased sense of smell and taste.  A cough, which worsens when you are lying flat.  Fatigue.  Fever.  Thick drainage from your nose, which often is green and may contain pus (purulent).  Swelling and warmth over the affected sinuses. DIAGNOSIS Your health care provider will perform a physical exam. During your exam, your health care provider may perform any of the following to help determine if you have acute sinusitis or chronic sinusitis:  Look in  your nose for signs of abnormal growths in your nostrils (nasal polyps).  Tap over the affected sinus to check for signs of infection.  View the inside of your sinuses using an imaging device that has a light attached (endoscope). If your health care provider suspects that you have chronic sinusitis, one or more of the following tests may be recommended:  Allergy tests.  Nasal culture. A sample of mucus is taken from your nose, sent to a lab, and screened for bacteria.  Nasal cytology. A sample of mucus is taken from your nose and examined by your health care provider to determine if your sinusitis is related to an allergy. TREATMENT Most cases of acute sinusitis are related to a viral infection and will resolve on their own within 10 days. Sometimes, medicines are prescribed to help relieve symptoms of both acute and chronic sinusitis. These may include pain medicines, decongestants, nasal steroid sprays, or saline sprays. However, for sinusitis related to a bacterial infection, your health care provider will prescribe antibiotic medicines. These are medicines that will help kill the bacteria causing the infection. Rarely, sinusitis is caused by a fungal infection. In these cases, your health care provider will prescribe antifungal medicine. For some cases of chronic sinusitis, surgery is needed. Generally, these are cases in which sinusitis recurs more than 3 times per year, despite other treatments. HOME CARE INSTRUCTIONS  Drink plenty of water. Water helps thin the mucus so your sinuses can drain more easily.  Use a humidifier.  Inhale steam 3-4 times a day (for example, sit in the bathroom with the shower running).  Apply  a warm, moist washcloth to your face 3-4 times a day, or as directed by your health care provider.  Use saline nasal sprays to help moisten and clean your sinuses.  Take medicines only as directed by your health care provider.  If you were prescribed either an  antibiotic or antifungal medicine, finish it all even if you start to feel better. SEEK IMMEDIATE MEDICAL CARE IF:  You have increasing pain or severe headaches.  You have nausea, vomiting, or drowsiness.  You have swelling around your face.  You have vision problems.  You have a stiff neck.  You have difficulty breathing.   This information is not intended to replace advice given to you by your health care provider. Make sure you discuss any questions you have with your health care provider.   Document Released: 09/10/2005 Document Revised: 10/01/2014 Document Reviewed: 09/25/2011 Elsevier Interactive Patient Education [employer].

## 2015-11-21 ENCOUNTER — Other Ambulatory Visit: Payer: Self-pay

## 2015-11-21 DIAGNOSIS — Z1231 Encounter for screening mammogram for malignant neoplasm of breast: Secondary | ICD-10-CM

## 2015-11-23 ENCOUNTER — Ambulatory Visit
Admission: RE | Admit: 2015-11-23 | Discharge: 2015-11-23 | Disposition: A | Discharge status: Home / Self Care | Payer: BLUE CROSS/BLUE SHIELD | Source: Ambulatory Visit

## 2015-11-23 DIAGNOSIS — Z1231 Encounter for screening mammogram for malignant neoplasm of breast: Secondary | ICD-10-CM

## 2015-11-25 ENCOUNTER — Other Ambulatory Visit: Payer: Self-pay | Admitting: Obstetrics and Gynecology

## 2015-11-25 DIAGNOSIS — R928 Other abnormal and inconclusive findings on diagnostic imaging of breast: Secondary | ICD-10-CM

## 2015-12-02 ENCOUNTER — Other Ambulatory Visit: Payer: BLUE CROSS/BLUE SHIELD

## 2015-12-06 ENCOUNTER — Ambulatory Visit
Admission: RE | Admit: 2015-12-06 | Discharge: 2015-12-06 | Disposition: A | Discharge status: Home / Self Care | Payer: BLUE CROSS/BLUE SHIELD | Source: Ambulatory Visit | Attending: Obstetrics and Gynecology | Admitting: Obstetrics and Gynecology

## 2015-12-06 DIAGNOSIS — R928 Other abnormal and inconclusive findings on diagnostic imaging of breast: Secondary | ICD-10-CM

## 2016-02-12 ENCOUNTER — Other Ambulatory Visit: Payer: Self-pay | Admitting: Internal Medicine

## 2016-02-13 NOTE — Telephone Encounter (Signed)
Sent to the pharmacy by e-scribe. 

## 2016-02-19 IMAGING — US US BREAST*R* LIMITED INC AXILLA
1 series · 4 of 4 positions shown · non-contrast
Comparison: None.

CLINICAL DATA: Callback from screening mammogram for possible mass
right breast

EXAM:
DIGITAL DIAGNOSTIC  RIGHT MAMMOGRAM
ULTRASOUND RIGHT BREAST

[Series 1: us breast*right* limited inc axilla · 4 of 4 slices shown]
[im 1/4]
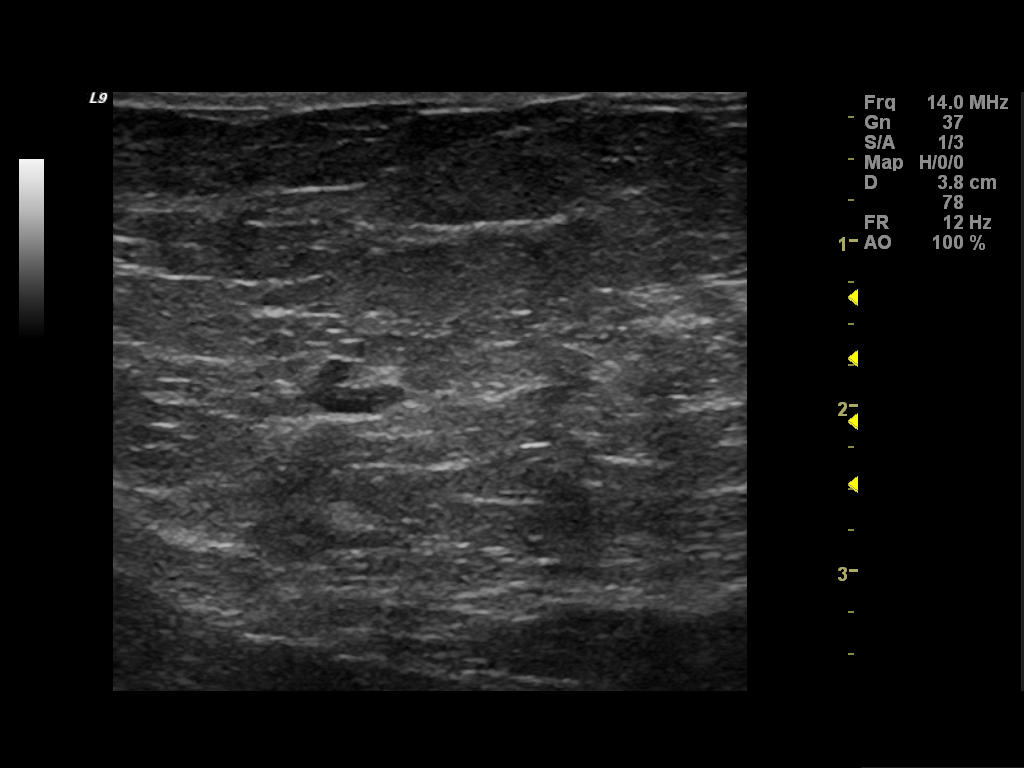
[im 2/4]
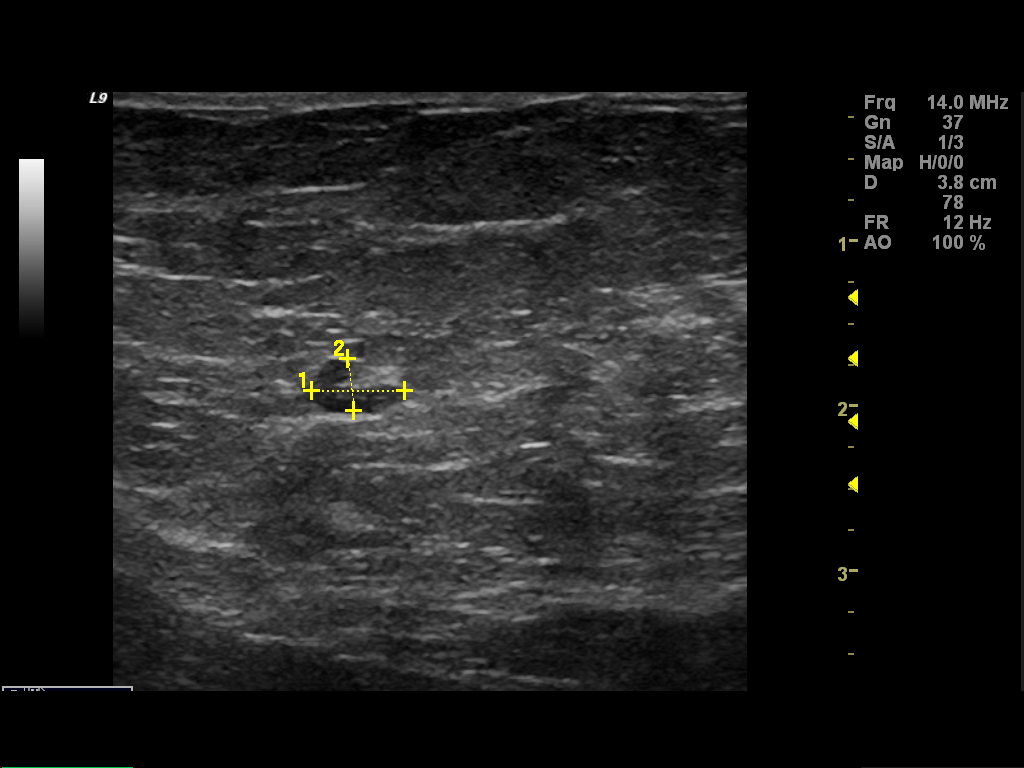
[im 3/4]
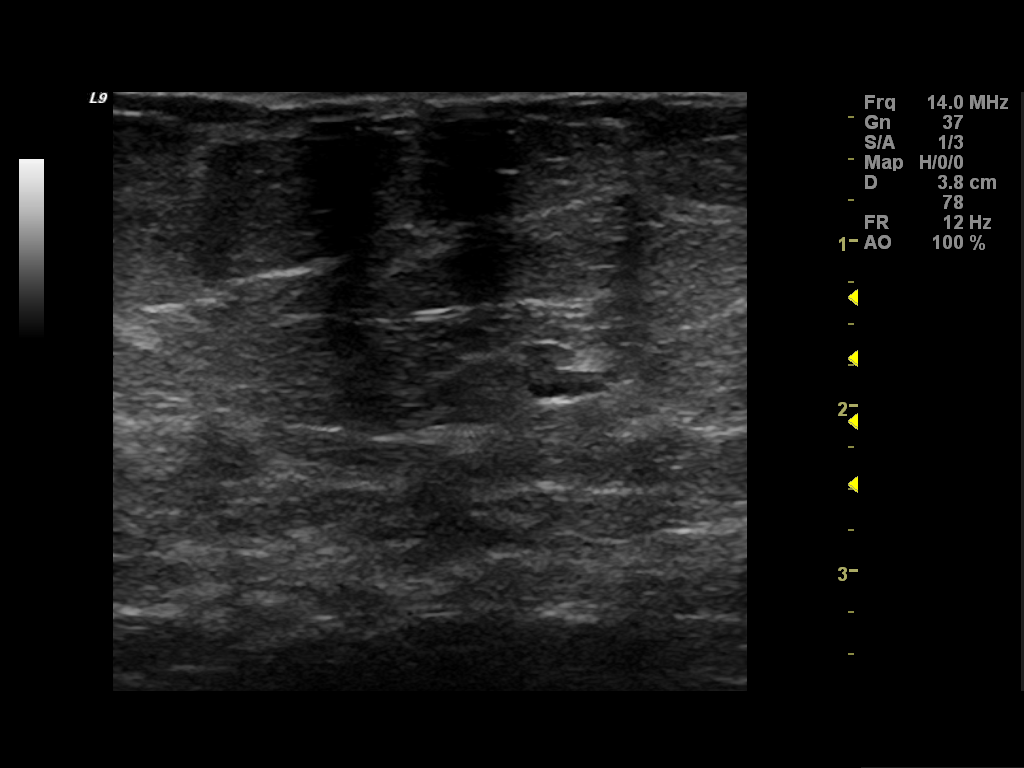
[im 4/4]
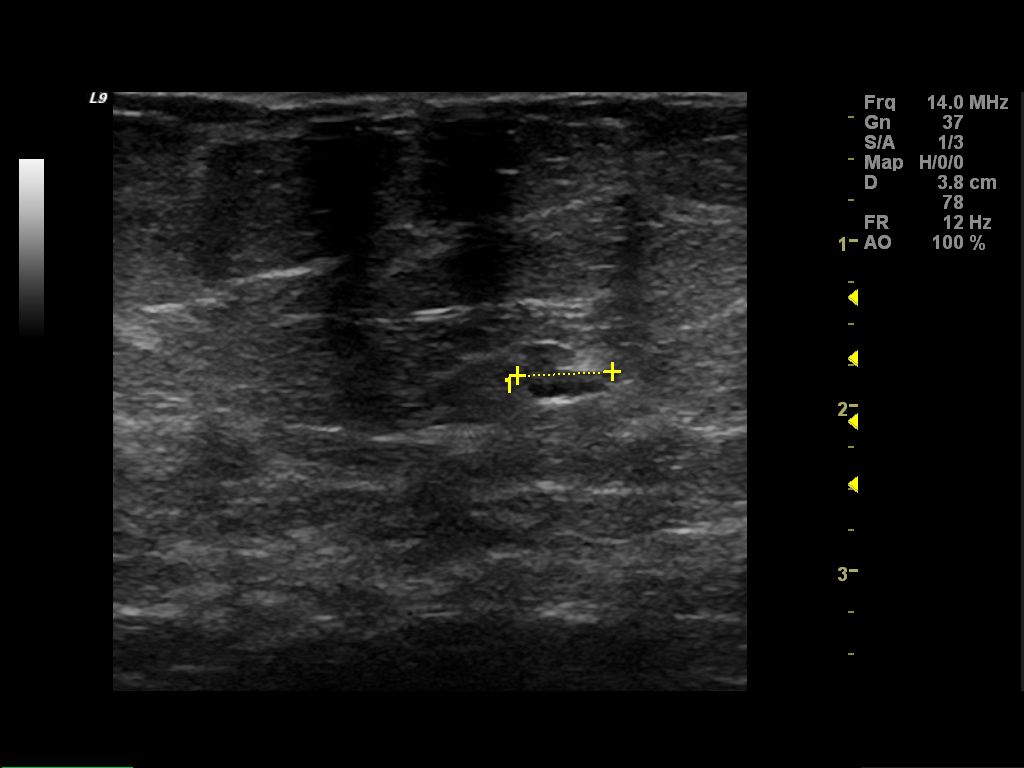

[4 of 4 positions shown; findings below may reference images not displayed]

ACR Breast Density Category b: There are scattered areas of
fibroglandular density.
FINDINGS: Spot compression CC and MLO views of the right breast are submitted.
Previously questioned asymmetry persists on additional views.

Ultrasound is performed, showing a 0.56 x 0.32 x 0.58 cm
intramammary lymph node at the right breast 10 o'clock 2 cm from
nipple correlating to the mammographic finding.
IMPRESSION: Benign findings.

RECOMMENDATION:
Routine screening mammogram back on schedule.

I have discussed the findings and recommendations with the patient.
Results were also provided in writing at the conclusion of the
visit. If applicable, a reminder letter will be sent to the patient
regarding the next appointment.

BI-RADS CATEGORY  2: Benign Finding(s)

## 2016-02-19 IMAGING — MG MM DIAGNOSTIC UNILATERAL R
2 series · 2 of 2 positions shown · non-contrast
Comparison: None.

CLINICAL DATA: Callback from screening mammogram for possible mass
right breast

EXAM:
DIGITAL DIAGNOSTIC  RIGHT MAMMOGRAM
ULTRASOUND RIGHT BREAST

[R CC]
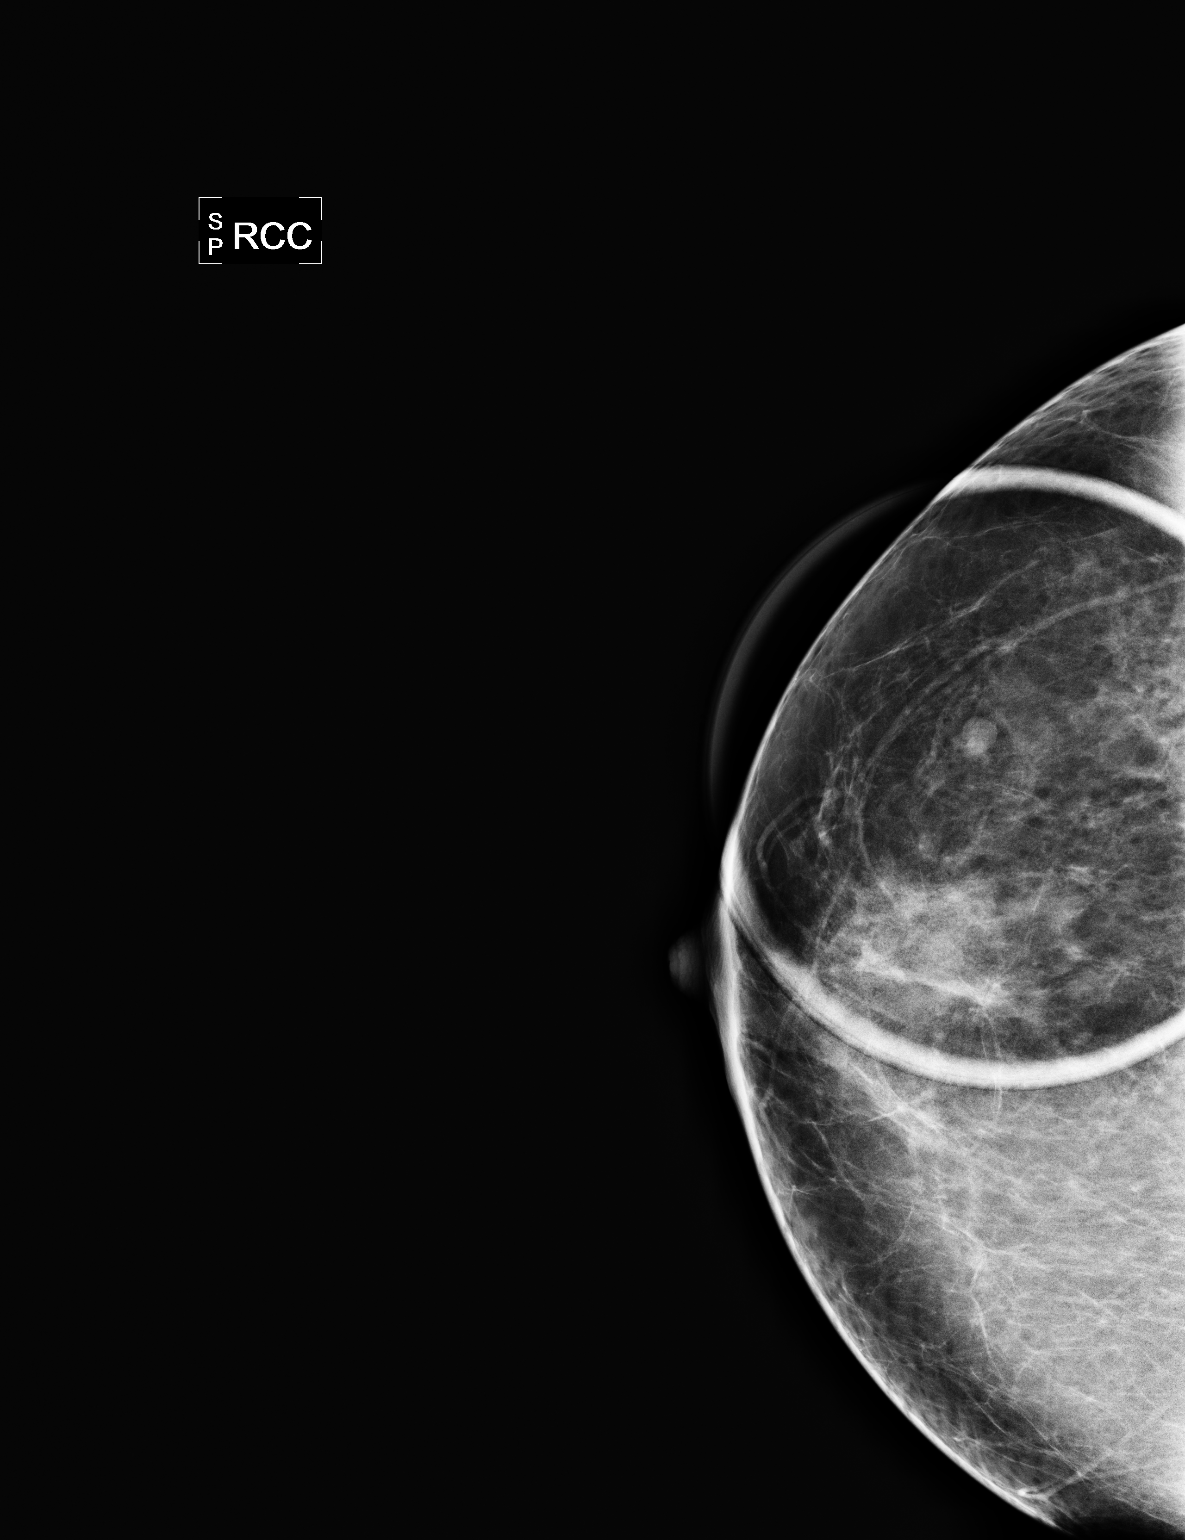

[R MLO]
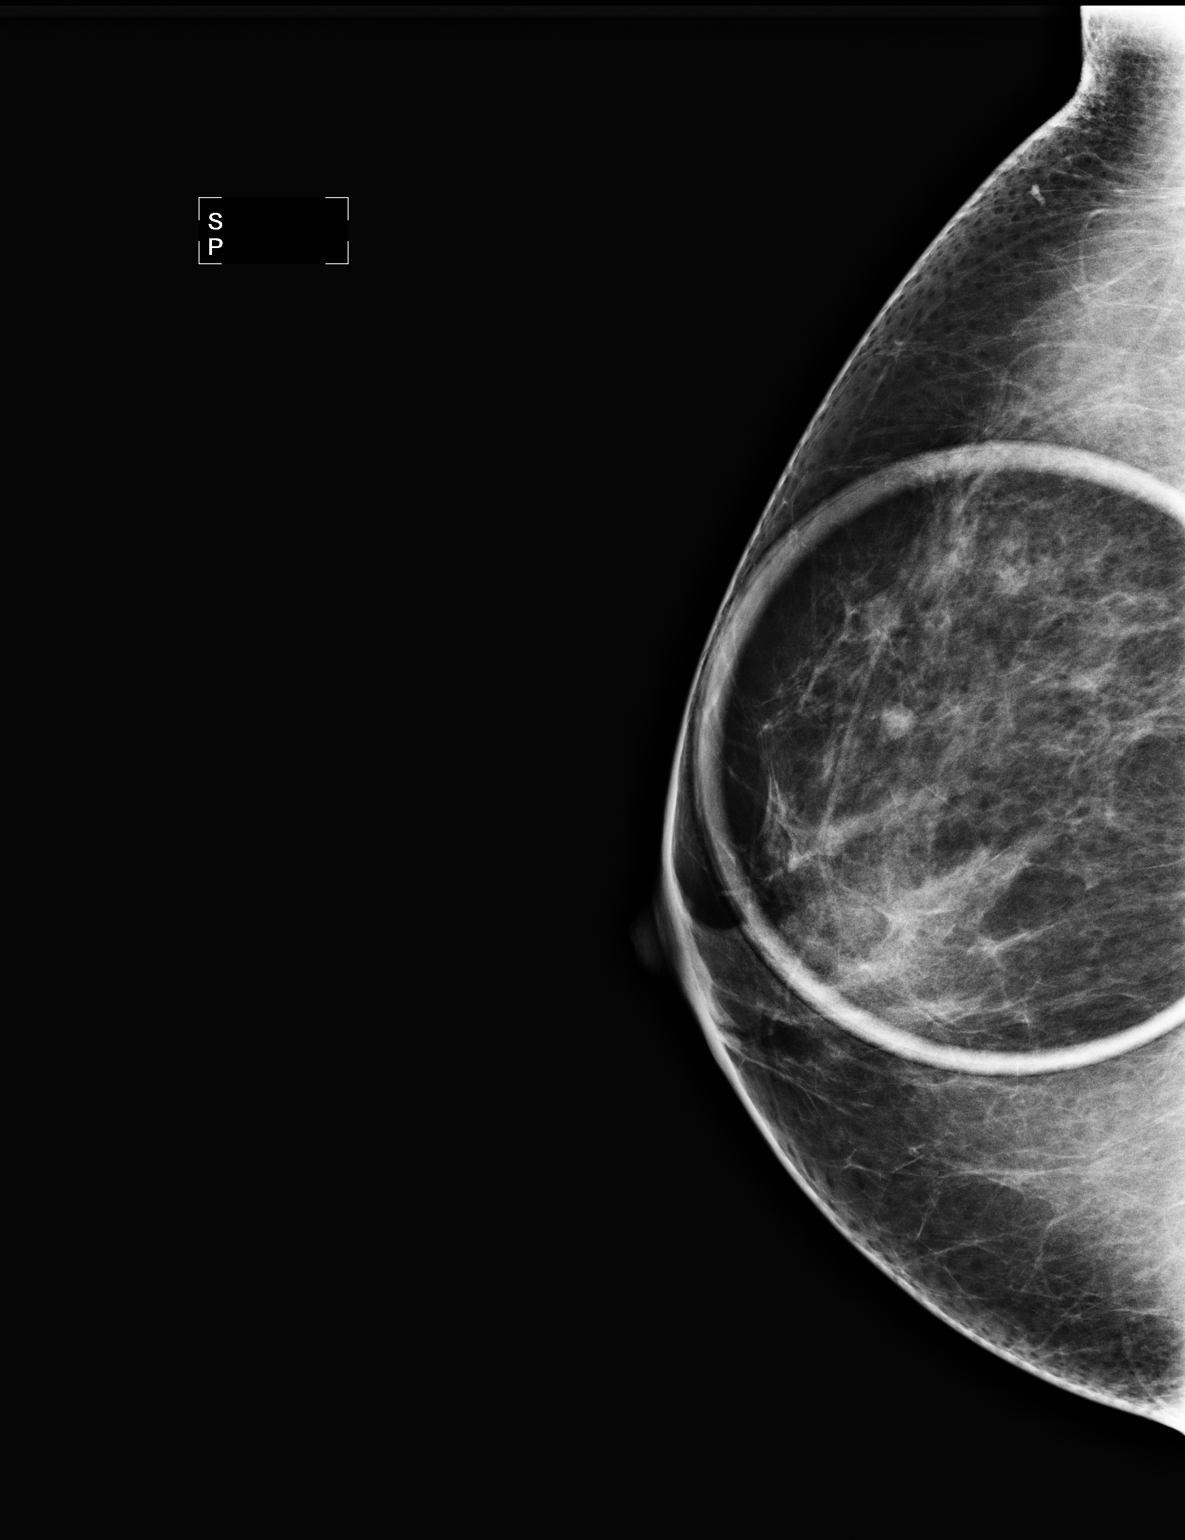

[2 of 2 positions shown; findings below may reference images not displayed]

ACR Breast Density Category b: There are scattered areas of
fibroglandular density.
FINDINGS: Spot compression CC and MLO views of the right breast are submitted.
Previously questioned asymmetry persists on additional views.

Ultrasound is performed, showing a 0.56 x 0.32 x 0.58 cm
intramammary lymph node at the right breast 10 o'clock 2 cm from
nipple correlating to the mammographic finding.
IMPRESSION: Benign findings.

RECOMMENDATION:
Routine screening mammogram back on schedule.

I have discussed the findings and recommendations with the patient.
Results were also provided in writing at the conclusion of the
visit. If applicable, a reminder letter will be sent to the patient
regarding the next appointment.

BI-RADS CATEGORY  2: Benign Finding(s)

## 2016-05-30 ENCOUNTER — Other Ambulatory Visit: Payer: Self-pay | Admitting: Internal Medicine

## 2016-07-06 ENCOUNTER — Encounter: Payer: Self-pay | Admitting: Internal Medicine

## 2016-08-08 ENCOUNTER — Other Ambulatory Visit (INDEPENDENT_AMBULATORY_CARE_PROVIDER_SITE_OTHER): Payer: BLUE CROSS/BLUE SHIELD

## 2016-08-08 DIAGNOSIS — Z0000 Encounter for general adult medical examination without abnormal findings: Secondary | ICD-10-CM | POA: Diagnosis not present

## 2016-08-08 DIAGNOSIS — R7989 Other specified abnormal findings of blood chemistry: Secondary | ICD-10-CM

## 2016-08-08 LAB — CBC WITH DIFFERENTIAL/PLATELET
BASOS ABS: 0 10*3/uL (ref 0.0–0.1)
BASOS PCT: 0.5 % (ref 0.0–3.0)
EOS PCT: 2.7 % (ref 0.0–5.0)
Eosinophils Absolute: 0.2 10*3/uL (ref 0.0–0.7)
HEMATOCRIT: 37.7 % (ref 36.0–46.0)
Hemoglobin: 12.8 g/dL (ref 12.0–15.0)
LYMPHS PCT: 35 % (ref 12.0–46.0)
Lymphs Abs: 2.2 10*3/uL (ref 0.7–4.0)
MCHC: 34.1 g/dL (ref 30.0–36.0)
MCV: 87.2 fl (ref 78.0–100.0)
MONOS PCT: 6.2 % (ref 3.0–12.0)
Monocytes Absolute: 0.4 10*3/uL (ref 0.1–1.0)
NEUTROS ABS: 3.6 10*3/uL (ref 1.4–7.7)
Neutrophils Relative %: 55.6 % (ref 43.0–77.0)
PLATELETS: 166 10*3/uL (ref 150.0–400.0)
RBC: 4.32 Mil/uL (ref 3.87–5.11)
RDW: 12.2 % (ref 11.5–15.5)
WBC: 6.4 10*3/uL (ref 4.0–10.5)

## 2016-08-08 LAB — LIPID PANEL
Cholesterol: 167 mg/dL (ref 0–200)
HDL: 38.2 mg/dL — AB (ref >39.00)
NonHDL: 128.62
Total CHOL/HDL Ratio: 4
Triglycerides: 294 mg/dL — ABNORMAL HIGH (ref 0.0–149.0)
VLDL: 58.8 mg/dL — ABNORMAL HIGH (ref 0.0–40.0)

## 2016-08-08 LAB — HEPATIC FUNCTION PANEL
ALBUMIN: 4.1 g/dL (ref 3.5–5.2)
ALK PHOS: 74 U/L (ref 39–117)
ALT: 16 U/L (ref 0–35)
AST: 14 U/L (ref 0–37)
Bilirubin, Direct: 0 mg/dL (ref 0.0–0.3)
TOTAL PROTEIN: 7.1 g/dL (ref 6.0–8.3)
Total Bilirubin: 0.3 mg/dL (ref 0.2–1.2)

## 2016-08-08 LAB — BASIC METABOLIC PANEL
BUN: 12 mg/dL (ref 6–23)
CALCIUM: 9.3 mg/dL (ref 8.4–10.5)
CHLORIDE: 106 meq/L (ref 96–112)
CO2: 29 meq/L (ref 19–32)
Creatinine, Ser: 0.95 mg/dL (ref 0.40–1.20)
GFR: 67.2 mL/min (ref >60.00)
Glucose, Bld: 89 mg/dL (ref 70–99)
POTASSIUM: 3.9 meq/L (ref 3.5–5.1)
SODIUM: 142 meq/L (ref 135–145)

## 2016-08-08 LAB — LDL CHOLESTEROL, DIRECT: LDL DIRECT: 96 mg/dL

## 2016-08-08 LAB — TSH: TSH: 1.34 u[IU]/mL (ref 0.35–4.50)

## 2016-08-14 NOTE — Progress Notes (Signed)
Pre visit review using our clinic review tool, if applicable. No additional management support is needed unless otherwise documented below in the visit note.  Chief Complaint  Patient presents with  . Annual Exam    HPI: Patient  Lisa Bradford  45 y.o. comes in today for Preventive Health Care visit  And thyroid  Taking med qd   Has been doing mediterranean diet and lost some weight  Protein shake in am   Not a lot of exercise however. Refill for flonase  And thryoid  Health Maintenance  Topic Date Due  . HIV Screening  08/14/2017 (Originally 03/20/1986)  . PAP SMEAR  08/17/2017  . TETANUS/TDAP  04/01/2022  . INFLUENZA VACCINE  Completed   Health Maintenance Review LIFESTYLE:  Exercise:  Not a lot  Tobacco/ETS:n Alcohol: ocass not much Sugar beverages:creamer in coffee  4 tsp Sleep:6-8 Drug use: no HH of 1 =cat Work:ft 40+    ROS:  GEN/ HEENT: No fever, significant weight changes sweats headaches vision problems hearing changes, CV/ PULM; No chest pain shortness of breath cough, syncope,edema  change in exercise tolerance. GI /GU: No adominal pain, vomiting, change in bowel habits. No blood in the stool. No significant GU symptoms. SKIN/HEME: ,no acute skin rashes suspicious lesions or bleeding. No lymphadenopathy, nodules, masses.  NEURO/ PSYCH:  No neurologic signs such as weakness numbness. No depression anxiety. IMM/ Allergy: No unusual infections.  Allergy .   REST of 12 system review negative except as per HPI   Past Medical History:  Diagnosis Date  . Allergy   . Heart murmur    echo in 98  . Hypothyroidism    pos antibodies  goiter     Past Surgical History:  Procedure Laterality Date  . CERVICAL POLYPECTOMY      Family History  Problem Relation Age of Onset  . Cancer Mother     ovarian and cervical  . Cancer Maternal Grandmother     lung  . Cancer Maternal Grandfather     lung and pancreatic  . Coronary artery disease Father     stent     Social History   Social History  . Marital status: Single    Spouse name: N/A  . Number of children: N/A  . Years of education: N/A   Social History Main Topics  . Smoking status: Never Smoker  . Smokeless tobacco: Never Used  . Alcohol use 0.0 oz/week     Comment: once a month  . Drug use: No  . Sexual activity: No   Other Topics Concern  . None   Social History Narrative   HH of 1   Pet cat.   ocass exercise .   No tob    rare etoh   caffiene  2 per day   Work  40 hours per week.    Good sleep.     Outpatient Medications Prior to Visit  Medication Sig Dispense Refill  . levonorgestrel (MIRENA) 20 MCG/24HR IUD 1 each by Intrauterine route once.    . fluticasone (FLONASE) 50 MCG/ACT nasal spray INSTILL 2 SPRAYS IN EACH NOSTRIL EVERY DAY AS NEEDED FOR ALLERGIES AND SINUSITIS 16 g 2  . levothyroxine (SYNTHROID, LEVOTHROID) 100 MCG tablet TAKE 1 TABLET BY MOUTH DAILY 90 tablet 0  . amoxicillin-clavulanate (AUGMENTIN) 875-125 MG tablet Take 1 tablet by mouth every 12 (twelve) hours. 14 tablet 0   No facility-administered medications prior to visit.      EXAM:  BP 106/74 (  BP Location: Right Arm, Patient Position: Sitting, Cuff Size: Normal)   Temp 98.3 F (36.8 C) (Oral)   Ht 5\' 4"  (1.626 m)   Wt 172 lb 9.6 oz (78.3 kg)   BMI 29.63 kg/m   Body mass index is 29.63 kg/m.  Physical Exam: Vital signs reviewed WUJ:WJXB is a well-developed well-nourished alert cooperative    who appearsr stated age in no acute distress.  HEENT: normocephalic atraumatic , Eyes: PERRL EOM's full, conjunctiva clear, Nares: paten,t no deformity discharge or tenderness., Ears: no deformity EAC's clear TMs with normal landmarks. Mouth: clear OP, no lesions, edema.  Moist mucous membranes. Dentition in adequate repair. NECK: supple without masses, thyromegaly or bruits. CHEST/PULM:  Clear to auscultation and percussion breath sounds equal no wheeze , rales or rhonchi. No chest wall  deformities or tenderness.Breast: normal by inspection . No dimpling, discharge, masses, tenderness or discharge . CV: PMI is nondisplaced, S1 S2 no gallops, murmurs, rubs. Peripheral pulses are full without delay.No JVD .  ABDOMEN: Bowel sounds normal nontender  No guard or rebound, no hepato splenomegal no CVA tenderness.  No hernia. Extremtities:  No clubbing cyanosis or edema, no acute joint swelling or redness no focal atrophy NEURO:  Oriented x3, cranial nerves 3-12 appear to be intact, no obvious focal weakness,gait within normal limits no abnormal reflexes or asymmetrical SKIN: No acute rashes normal turgor, color, no bruising or petechiae. PSYCH: Oriented, good eye contact, no obvious depression anxiety, cognition and judgment appear normal. LN: no cervical axillary inguinal adenopathy  Lab Results  Component Value Date   WBC 6.4 08/08/2016   HGB 12.8 08/08/2016   HCT 37.7 08/08/2016   PLT 166.0 08/08/2016   GLUCOSE 89 08/08/2016   CHOL 167 08/08/2016   TRIG 294.0 (H) 08/08/2016   HDL 38.20 (L) 08/08/2016   LDLDIRECT 96.0 08/08/2016   LDLCALC 132 (H) 11/03/2013   ALT 16 08/08/2016   AST 14 08/08/2016   NA 142 08/08/2016   K 3.9 08/08/2016   CL 106 08/08/2016   CREATININE 0.95 08/08/2016   BUN 12 08/08/2016   CO2 29 08/08/2016   TSH 1.34 08/08/2016   Wt Readings from Last 3 Encounters:  08/15/16 172 lb 9.6 oz (78.3 kg)  09/28/15 181 lb 9.6 oz (82.4 kg)  08/12/15 177 lb 11.2 oz (80.6 kg)   BP Readings from Last 3 Encounters:  08/15/16 106/74  09/28/15 114/78  08/12/15 100/72    ASSESSMENT AND PLAN:  Discussed the following assessment and plan:  Visit for preventive health examination  Hypothyroidism, unspecified type  Hyperlipidemia, unspecified hyperlipidemia type - low hds elevaetd TG  Need for prophylactic vaccination and inoculation against influenza - Plan: Flu Vaccine QUAD 36+ mos PF IM (Fluarix & Fluzone Quad PF)  Allergic rhinitis, unspecified  chronicity, unspecified seasonality, unspecified trigger Elevated despite some healthy weight loss  Intensify  intervention exercise etc   Take some fish oil   But  only one a day   Counseled. options   discussed  Recheck in year or earlier if wishes  Patient Care Team: Madelin Headings, MD as PCP - General Osborn Coho, MD (Obstetrics and Gynecology) Maris Berger, MD (Ophthalmology) dermatologist Patient Instructions   Intensify lifestyle interventions.  Wt Readings from Last 3 Encounters:  08/15/16 172 lb 9.6 oz (78.3 kg)  09/28/15 181 lb 9.6 oz (82.4 kg)  08/12/15 177 lb 11.2 oz (80.6 kg)   Decrease simple    Carbohydrates.   Processed carbs .  Increase exercise  15- 30  Minutes per day walking is ok even broke up can help Check sugar content of the foods . And condiments .  Fish   Oil ok but fish food better   Avoid transfats ,  Also .   Food Choices to Lower Your Triglycerides Triglycerides are a type of fat in your blood. High levels of triglycerides can increase the risk of heart disease and stroke. If your triglyceride levels are high, the foods you eat and your eating habits are very important. Choosing the right foods can help lower your triglycerides. What general guidelines do I need to follow?  Lose weight if you are overweight.  Limit or avoid alcohol.  Fill one half of your plate with vegetables and green salads.  Limit fruit to two servings a day. Choose fruit instead of juice.  Make one fourth of your plate whole grains. Look for the word "whole" as the first word in the ingredient list.  Fill one fourth of your plate with lean protein foods.  Enjoy fatty fish (such as salmon, mackerel, sardines, and tuna) three times a week.  Choose healthy fats.  Limit foods high in starch and sugar.  Eat more home-cooked food and less restaurant, buffet, and fast food.  Limit fried foods.  Cook foods using methods other than frying.  Limit saturated  fats.  Check ingredient lists to avoid foods with partially hydrogenated oils (trans fats) in them. What foods can I eat? Grains  Whole grains, such as whole wheat or whole grain breads, crackers, cereals, and pasta. Unsweetened oatmeal, bulgur, barley, quinoa, or brown rice. Corn or whole wheat flour tortillas. Vegetables  Fresh or frozen vegetables (raw, steamed, roasted, or grilled). Green salads. Fruits  All fresh, canned (in natural juice), or frozen fruits. Meat and Other Protein Products  Ground beef (85% or leaner), grass-fed beef, or beef trimmed of fat. Skinless chicken or [place]. Ground chicken or [place]. Pork trimmed of fat. All fish and seafood. Eggs. Dried beans, peas, or lentils. Unsalted nuts or seeds. Unsalted canned or dry beans. Dairy  Low-fat dairy products, such as skim or 1% milk, 2% or reduced-fat cheeses, low-fat ricotta or cottage cheese, or plain low-fat yogurt. Fats and Oils  Tub margarines without trans fats. Light or reduced-fat mayonnaise and salad dressings. Avocado. Safflower, olive, or canola oils. Natural peanut or almond butter. The items listed above may not be a complete list of recommended foods or beverages. Contact your dietitian for more options.  What foods are not recommended? Grains  White bread. White pasta. White rice. Cornbread. Bagels, pastries, and croissants. Crackers that contain trans fat. Vegetables  White potatoes. Corn. Creamed or fried vegetables. Vegetables in a cheese sauce. Fruits  Dried fruits. Canned fruit in light or heavy syrup. Fruit juice. Meat and Other Protein Products  Fatty cuts of meat. Ribs, chicken wings, bacon, sausage, bologna, salami, chitterlings, fatback, hot dogs, bratwurst, and packaged luncheon meats. Dairy  Whole or 2% milk, cream, half-and-half, and cream cheese. Whole-fat or sweetened yogurt. Full-fat cheeses. Nondairy creamers and whipped toppings. Processed cheese, cheese spreads, or cheese curds. Sweets  and Desserts  Corn syrup, sugars, honey, and molasses. Candy. Jam and jelly. Syrup. Sweetened cereals. Cookies, pies, cakes, donuts, muffins, and ice cream. Fats and Oils  Butter, stick margarine, lard, shortening, ghee, or bacon fat. Coconut, palm kernel, or palm oils. Beverages  Alcohol. Sweetened drinks (such as sodas, lemonade, and fruit drinks or punches). The items listed above may  not be a complete list of foods and beverages to avoid. Contact your dietitian for more information.  This information is not intended to replace advice given to you by your health care provider. Make sure you discuss any questions you have with your health care provider. Document Released: 06/28/2004 Document Revised: 02/16/2016 Document Reviewed: 07/15/2013 Elsevier Interactive Patient Education  2017 [employer]    [place] GapWanda K. Caryssa Bradford M.D.

## 2016-08-15 ENCOUNTER — Ambulatory Visit (INDEPENDENT_AMBULATORY_CARE_PROVIDER_SITE_OTHER): Payer: BLUE CROSS/BLUE SHIELD | Admitting: Internal Medicine

## 2016-08-15 ENCOUNTER — Encounter: Payer: Self-pay | Admitting: Internal Medicine

## 2016-08-15 VITALS — BP 106/74 | Temp 98.3°F | Ht 64.0 in | Wt 172.6 lb

## 2016-08-15 DIAGNOSIS — Z0000 Encounter for general adult medical examination without abnormal findings: Secondary | ICD-10-CM

## 2016-08-15 DIAGNOSIS — E785 Hyperlipidemia, unspecified: Secondary | ICD-10-CM | POA: Diagnosis not present

## 2016-08-15 DIAGNOSIS — E039 Hypothyroidism, unspecified: Secondary | ICD-10-CM | POA: Diagnosis not present

## 2016-08-15 DIAGNOSIS — Z23 Encounter for immunization: Secondary | ICD-10-CM

## 2016-08-15 DIAGNOSIS — J309 Allergic rhinitis, unspecified: Secondary | ICD-10-CM

## 2016-08-15 MED ORDER — LEVOTHYROXINE SODIUM 100 MCG PO TABS: 100.0000 ug | ORAL_TABLET | Freq: Every day | ORAL | 3 refills | Status: DC

## 2016-08-15 MED ORDER — FLUTICASONE PROPIONATE 50 MCG/ACT NA SUSP: NASAL | 3 refills | Status: DC

## 2016-08-15 NOTE — Patient Instructions (Addendum)
Intensify lifestyle interventions.  Wt Readings from Last 3 Encounters:  08/15/16 172 lb 9.6 oz (78.3 kg)  09/28/15 181 lb 9.6 oz (82.4 kg)  08/12/15 177 lb 11.2 oz (80.6 kg)   Decrease simple    Carbohydrates.   Processed carbs .     Increase exercise  15- 30  Minutes per day walking is ok even broke up can help Check sugar content of the foods . And condiments .  Fish   Oil ok but fish food better   Avoid transfats ,  Also .   Food Choices to Lower Your Triglycerides Triglycerides are a type of fat in your blood. High levels of triglycerides can increase the risk of heart disease and stroke. If your triglyceride levels are high, the foods you eat and your eating habits are very important. Choosing the right foods can help lower your triglycerides. What general guidelines do I need to follow?  Lose weight if you are overweight.  Limit or avoid alcohol.  Fill one half of your plate with vegetables and green salads.  Limit fruit to two servings a day. Choose fruit instead of juice.  Make one fourth of your plate whole grains. Look for the word "whole" as the first word in the ingredient list.  Fill one fourth of your plate with lean protein foods.  Enjoy fatty fish (such as salmon, mackerel, sardines, and tuna) three times a week.  Choose healthy fats.  Limit foods high in starch and sugar.  Eat more home-cooked food and less restaurant, buffet, and fast food.  Limit fried foods.  Cook foods using methods other than frying.  Limit saturated fats.  Check ingredient lists to avoid foods with partially hydrogenated oils (trans fats) in them. What foods can I eat? Grains  Whole grains, such as whole wheat or whole grain breads, crackers, cereals, and pasta. Unsweetened oatmeal, bulgur, barley, quinoa, or brown rice. Corn or whole wheat flour tortillas. Vegetables  Fresh or frozen vegetables (raw, steamed, roasted, or grilled). Green salads. Fruits  All fresh, canned (in  natural juice), or frozen fruits. Meat and Other Protein Products  Ground beef (85% or leaner), grass-fed beef, or beef trimmed of fat. Skinless chicken or [place]. Ground chicken or [place]. Pork trimmed of fat. All fish and seafood. Eggs. Dried beans, peas, or lentils. Unsalted nuts or seeds. Unsalted canned or dry beans. Dairy  Low-fat dairy products, such as skim or 1% milk, 2% or reduced-fat cheeses, low-fat ricotta or cottage cheese, or plain low-fat yogurt. Fats and Oils  Tub margarines without trans fats. Light or reduced-fat mayonnaise and salad dressings. Avocado. Safflower, olive, or canola oils. Natural peanut or almond butter. The items listed above may not be a complete list of recommended foods or beverages. Contact your dietitian for more options.  What foods are not recommended? Grains  White bread. White pasta. White rice. Cornbread. Bagels, pastries, and croissants. Crackers that contain trans fat. Vegetables  White potatoes. Corn. Creamed or fried vegetables. Vegetables in a cheese sauce. Fruits  Dried fruits. Canned fruit in light or heavy syrup. Fruit juice. Meat and Other Protein Products  Fatty cuts of meat. Ribs, chicken wings, bacon, sausage, bologna, salami, chitterlings, fatback, hot dogs, bratwurst, and packaged luncheon meats. Dairy  Whole or 2% milk, cream, half-and-half, and cream cheese. Whole-fat or sweetened yogurt. Full-fat cheeses. Nondairy creamers and whipped toppings. Processed cheese, cheese spreads, or cheese curds. Sweets and Desserts  Corn syrup, sugars, honey, and molasses. Candy. Jam and  jelly. Syrup. Sweetened cereals. Cookies, pies, cakes, donuts, muffins, and ice cream. Fats and Oils  Butter, stick margarine, lard, shortening, ghee, or bacon fat. Coconut, palm kernel, or palm oils. Beverages  Alcohol. Sweetened drinks (such as sodas, lemonade, and fruit drinks or punches). The items listed above may not be a complete list of foods and beverages  to avoid. Contact your dietitian for more information.  This information is not intended to replace advice given to you by your health care provider. Make sure you discuss any questions you have with your health care provider. Document Released: 06/28/2004 Document Revised: 02/16/2016 Document Reviewed: 07/15/2013 Elsevier Interactive Patient Education  2017 [employer]

## 2017-03-01 ENCOUNTER — Encounter: Payer: Self-pay | Admitting: Adult Health

## 2017-03-01 ENCOUNTER — Ambulatory Visit (INDEPENDENT_AMBULATORY_CARE_PROVIDER_SITE_OTHER): Payer: PRIVATE HEALTH INSURANCE | Admitting: Adult Health

## 2017-03-01 VITALS — BP 110/88 | HR 60 | Temp 98.3°F | Wt 166.1 lb

## 2017-03-01 DIAGNOSIS — B029 Zoster without complications: Secondary | ICD-10-CM | POA: Diagnosis not present

## 2017-03-01 MED ORDER — VALACYCLOVIR HCL 1 G PO TABS: 1000.0000 mg | ORAL_TABLET | Freq: Three times a day (TID) | ORAL | 0 refills | Status: AC

## 2017-03-01 NOTE — Progress Notes (Signed)
Subjective:    Patient ID: Lisa Bradford, female    DOB: [birth year], 46 y.o.   MRN: [id number]  HPI  46 year old female who  has a past medical history of Allergy; Heart murmur; and Hypothyroidism. She presents to the office today for a rash on her left leg that she noticed one week ago today. She reports that the rash is painful and is described as a " burning" pain. She has noticed some drainage. Has been present for about 5 days.   She has been taking Motrin which helps resolve the pain   Review of Systems See HPI   Past Medical History:  Diagnosis Date  . Allergy   . Heart murmur    echo in 98  . Hypothyroidism    pos antibodies  goiter     Social History   Social History  . Marital status: Single    Spouse name: N/A  . Number of children: N/A  . Years of education: N/A   Occupational History  . Not on file.   Social History Main Topics  . Smoking status: Never Smoker  . Smokeless tobacco: Never Used  . Alcohol use 0.0 oz/week     Comment: once a month  . Drug use: No  . Sexual activity: No   Other Topics Concern  . Not on file   Social History Narrative   HH of 1   Pet cat.   ocass exercise .   No tob    rare etoh   caffiene  2 per day   Work  40 hours per week.    Good sleep.     Past Surgical History:  Procedure Laterality Date  . CERVICAL POLYPECTOMY      Family History  Problem Relation Age of Onset  . Cancer Mother        ovarian and cervical  . Cancer Maternal Grandmother        lung  . Cancer Maternal Grandfather        lung and pancreatic  . Coronary artery disease Father        stent    Allergies  Allergen Reactions  . Other     Current Outpatient Prescriptions on File Prior to Visit  Medication Sig Dispense Refill  . fluticasone (FLONASE) 50 MCG/ACT nasal spray INSTILL 2 SPRAYS IN EACH NOSTRIL EVERY DAY AS NEEDED FOR ALLERGIES AND SINUSITIS 48 g 3  . levonorgestrel (MIRENA) 20 MCG/24HR IUD 1 each by Intrauterine  route once.    Marland Kitchen. levothyroxine (SYNTHROID, LEVOTHROID) 100 MCG tablet Take 1 tablet (100 mcg total) by mouth daily. 90 tablet 3   No current facility-administered medications on file prior to visit.     BP 110/88 (BP Location: Left Arm, Patient Position: Sitting, Cuff Size: Normal)   Pulse 60   Temp 98.3 F (36.8 C) (Oral)   Wt 166 lb 1.6 oz (75.3 kg)   SpO2 97%   BMI 28.51 kg/m       Objective:   Physical Exam  Constitutional: She is oriented to person, place, and time. She appears well-developed and well-nourished. No distress.  Neurological: She is alert and oriented to person, place, and time.  Skin: Skin is warm and dry. Rash noted. She is not diaphoretic. No erythema.   grouped herpetiform vesicles on an erythematous base located on left upper leg. Vesicles have started to scab over. No drainage noted. No signs of infection   Nursing note and vitals reviewed.  Assessment & Plan:  1. Herpes zoster without complication - We spoke about starting anti viral therapy greater than 72 hours after initial outbreak. She does report that she feels as though new vesicles are forming.  - valACYclovir (VALTREX) 1000 MG tablet; Take 1 tablet (1,000 mg total) by mouth 3 (three) times daily.  Dispense: 21 tablet; Refill: 0 - Continue with Nsaids for symptom relief - Follow up as needed or with any signs of infection   Shirline Frees, NP

## 2017-03-22 ENCOUNTER — Telehealth: Payer: Self-pay | Admitting: Internal Medicine

## 2017-03-22 NOTE — Telephone Encounter (Signed)
Patient Name: Lisa SkeensRHONDA Bradford DOB: Aug 19, 1971 Initial Comment Caller states she has nausea and vomiting, she is wondering about getting something called in for her. Nurse Assessment Nurse: [name], RN, [name] Date/Time (Eastern Time): 03/22/2017 12:18:01 PM Confirm and document reason for call. If symptomatic, describe symptoms. ---Caller states she is having some nausea and vomiting. She woke up at 3 am with diarrhea. 6am she started vomiting. Has vomited 3 times today. Has tried some saltine crackers and ginger ale but wasn't able to keep it down. Has had some chills but thermometer is not working Does the patient have any new or worsening symptoms? ---Yes Will a triage be completed? ---Yes Related visit to physician within the last 2 weeks? ---No Does the PT have any chronic conditions? (i.e. diabetes, asthma, etc.) ---No Is the patient pregnant or possibly pregnant? (Ask all females between the ages of 2512-55) ---No Is this a behavioral health or substance abuse call? ---No Guidelines Guideline Title Affirmed Question Affirmed Notes Vomiting MILD or MODERATE vomiting (e.g., 1 - 5 times / day) Final Disposition User Home Care [name], RN, [name] Disagree/Comply: Comply

## 2017-03-22 NOTE — Telephone Encounter (Signed)
Returned call to patient; patient reports feeling somewhat better, encouraged patient to get plenty of fluids and hydration; patient reports that she has spoken with another nurse and MD and they called in a medication to her pharmacy; patient was unable to recall nurse/MD called in medication; patient voiced understanding to instructions and to return call to PCP if symptoms worsen or new symptoms arise.

## 2017-06-14 ENCOUNTER — Encounter: Payer: Self-pay | Admitting: Internal Medicine

## 2017-07-24 ENCOUNTER — Ambulatory Visit (INDEPENDENT_AMBULATORY_CARE_PROVIDER_SITE_OTHER): Payer: PRIVATE HEALTH INSURANCE | Admitting: Internal Medicine

## 2017-07-24 ENCOUNTER — Encounter: Payer: Self-pay | Admitting: Internal Medicine

## 2017-07-24 VITALS — BP 98/62 | HR 62 | Temp 98.2°F | Wt 167.0 lb

## 2017-07-24 DIAGNOSIS — R140 Abdominal distension (gaseous): Secondary | ICD-10-CM | POA: Diagnosis not present

## 2017-07-24 DIAGNOSIS — R1013 Epigastric pain: Secondary | ICD-10-CM | POA: Diagnosis not present

## 2017-07-24 NOTE — Progress Notes (Signed)
Chief Complaint  Patient presents with  . GI Problem    x 1 week. Pt states that everytime she eats her stomach feels like it "balls up" and hurts. Pt has not noticed any triggers. Denies nausea or vomiting.     HPI: Lisa Bradford 46 y.o. SDA    lasr visit with me was 11 17  She has had 1 week of postprandial epigastric pain begins about 30 minutes after eating no matter what she eats no vomiting occasional nausea. No history of the same although she does get gas pains that runs in her family and takes anti-gas medicine at times. No nocturnal awakenings does take ranitidine 150 mg in the morning for heartburn which does help.  Today had an episode that began at about 7 AM and did not stop until 1030-11.  No radiation no dysphasia difficulty eating otherwise. No fever sore throat respiratory. ROS: See pertinent positives and negatives per HPI. lmp  Past Medical History:  Diagnosis Date  . Allergy   . Heart murmur    echo in 98  . Hypothyroidism    pos antibodies  goiter     Family History  Problem Relation Age of Onset  . Cancer Mother        ovarian and cervical  . Cancer Maternal Grandmother        lung  . Cancer Maternal Grandfather        lung and pancreatic  . Coronary artery disease Father        stent    Social History   Social History  . Marital status: Single    Spouse name: N/A  . Number of children: N/A  . Years of education: N/A   Social History Main Topics  . Smoking status: Never Smoker  . Smokeless tobacco: Never Used  . Alcohol use 0.0 oz/week     Comment: once a month  . Drug use: No  . Sexual activity: No   Other Topics Concern  . None   Social History Narrative   HH of 1   Pet cat.   ocass exercise .   No tob    rare etoh   caffiene  2 per day   Work  40 hours per week.    Good sleep.     Outpatient Medications Prior to Visit  Medication Sig Dispense Refill  . fluticasone (FLONASE) 50 MCG/ACT nasal spray INSTILL 2 SPRAYS  IN EACH NOSTRIL EVERY DAY AS NEEDED FOR ALLERGIES AND SINUSITIS 48 g 3  . levonorgestrel (MIRENA) 20 MCG/24HR IUD 1 each by Intrauterine route once.    Marland Kitchen. levothyroxine (SYNTHROID, LEVOTHROID) 100 MCG tablet Take 1 tablet (100 mcg total) by mouth daily. 90 tablet 3   No facility-administered medications prior to visit.      EXAM:  BP 98/62 (BP Location: Right Arm, Patient Position: Sitting, Cuff Size: Normal)   Pulse 62   Temp 98.2 F (36.8 C) (Oral)   Wt 167 lb (75.8 kg)   BMI 28.67 kg/m   Body mass index is 28.67 kg/m.  GENERAL: vitals reviewed and listed above, alert, oriented, appears well hydrated and in no acute distress HEENT: atraumatic, conjunctiva  clear, no obvious abnormalities on inspection of external nose and ears OP : no lesion edema or exudate  NECK: no obvious masses on inspection palpation  CV: HRRR, no clubbing cyanosis or  peripheral edema nl cap refill  Abdomen soft without organomegaly guarding or rebound area of tenderness reported  high epigastrium but not bothering her at this moment. MS: moves all extremities without noticeable focal  Abnormality Skin no icterus no acute petechiae or bruising. PSYCH: pleasant and cooperative, no obvious depression or anxiety Lab Results  Component Value Date   WBC 6.4 08/08/2016   HGB 12.8 08/08/2016   HCT 37.7 08/08/2016   PLT 166.0 08/08/2016   GLUCOSE 89 08/08/2016   CHOL 167 08/08/2016   TRIG 294.0 (H) 08/08/2016   HDL 38.20 (L) 08/08/2016   LDLDIRECT 96.0 08/08/2016   LDLCALC 132 (H) 11/03/2013   ALT 16 08/08/2016   AST 14 08/08/2016   NA 142 08/08/2016   K 3.9 08/08/2016   CL 106 08/08/2016   CREATININE 0.95 08/08/2016   BUN 12 08/08/2016   CO2 29 08/08/2016   TSH 1.34 08/08/2016   Wt Readings from Last 3 Encounters:  07/24/17 167 lb (75.8 kg)  03/01/17 166 lb 1.6 oz (75.3 kg)  08/15/16 172 lb 9.6 oz (78.3 kg)    ASSESSMENT AND PLAN:  Discussed the following assessment and plan:  Postprandial  epigastric pain  Gassiness - Chronic ongoing family history treats with over-the-counter's  -Patient advised to return or notify health care team  if symptoms worsen ,persist or new concerns arise.  Patient Instructions  Take over-the-counter omeprazole 20 mg once a day.   Until you have follow up  You can take the ranitidine in addition if needed.  Advise follow-up visit in about 3 weeks.  If the omeprazole is not improving the situation the next step would be getting an abdominal ultrasound to check her gallbladder area.  If things become worse and there is vomiting involved get back with [place] earlier. Avoid Advil Aleve products which you are not taking at this time anyway.  Consider trying the FODMAPS  diet for gas prevention it does help some people with gas/ IBS symptoms    Neta Mends. Teylor Wolven M.D.

## 2017-07-24 NOTE — Patient Instructions (Addendum)
Take over-the-counter omeprazole 20 mg once a day.   Until you have follow up  You can take the ranitidine in addition if needed.  Advise follow-up visit in about 3 weeks.  If the omeprazole is not improving the situation the next step would be getting an abdominal ultrasound to check her gallbladder area.  If things become worse and there is vomiting involved get back with us earlier. Avoid Advil Aleve products which you are not taking at this time anyway.  Consider trying the FODMAPS  diet for gas prevention it does help some people with gas/ IBS symptoms

## 2017-08-14 NOTE — Progress Notes (Signed)
Chief Complaint  Patient presents with  . Annual Exam    HPI: Patient  Lisa Bradford  46 y.o. comes in today for Preventive Health Care visit   Since  [place] visit  Gi sx pp sx gone on omeprazole   qd  Taking thryoid daily  Only ocass miss   l knee pains when walking shopping no injury like a catch ok now no swelling     Health Maintenance  Topic Date Due  . HIV Screening  03/20/1985  . INFLUENZA VACCINE  08/19/2017 (Originally 04/24/2017)  . PAP SMEAR  08/23/2019  . TETANUS/TDAP  04/01/2022   Health Maintenance Review LIFESTYLE:  Exercise:   Not as much recently  Tobacco/ETS: no Alcohol:  1 blu moon  Sugar beverages:   Weekly  Sleep: 7-8 hours  Drug use: no HH of  2 2 pets  Work:   About 99 +  Father stent about age 32  Gm had stroke   Ob usually gets vit d level but no rx and  No dx at this time    ROS:  GEN/ HEENT: No fever, significant weight changes sweats headaches vision problems hearing changes, CV/ PULM; No chest pain shortness of breath cough, syncope,edema  change in exercise tolerance. GI /GU: No adominal pain, vomiting, change in bowel habits. No blood in the stool. No significant GU symptoms. SKIN/HEME: ,no acute skin rashes suspicious lesions or bleeding. No lymphadenopathy, nodules, masses.  NEURO/ PSYCH:  No neurologic signs such as weakness numbness. No depression anxiety. IMM/ Allergy: No unusual infections.  Allergy .   REST of 12 system review negative except as per HPI   Past Medical History:  Diagnosis Date  . Allergy   . Heart murmur    echo in 98  . Hypothyroidism    pos antibodies  goiter     Past Surgical History:  Procedure Laterality Date  . CERVICAL POLYPECTOMY      Family History  Problem Relation Age of Onset  . Cancer Mother        ovarian and cervical  . Cancer Maternal Grandmother        lung  . Cancer Maternal Grandfather        lung and pancreatic  . Coronary artery disease Father        stent    Social  History   Socioeconomic History  . Marital status: Single    Spouse name: None  . Number of children: None  . Years of education: None  . Highest education level: None  Social Needs  . Financial resource strain: None  . Food insecurity - worry: None  . Food insecurity - inability: None  . Transportation needs - medical: None  . Transportation needs - non-medical: None  Occupational History  . None  Tobacco Use  . Smoking status: Never Smoker  . Smokeless tobacco: Never Used  Substance and Sexual Activity  . Alcohol use: Yes    Alcohol/week: 0.0 oz    Comment: once a month  . Drug use: No  . Sexual activity: No    Birth control/protection: IUD  Other Topics Concern  . None  Social History Narrative   HH of 1   Pet cat.   ocass exercise .   No tob    rare etoh   caffiene  2 per day   Work  40 hours per week.    Good sleep.     Outpatient Medications Prior to Visit  Medication Sig Dispense Refill  . fluticasone (FLONASE) 50 MCG/ACT nasal spray INSTILL 2 SPRAYS IN EACH NOSTRIL EVERY DAY AS NEEDED FOR ALLERGIES AND SINUSITIS 48 g 3  . levonorgestrel (MIRENA) 20 MCG/24HR IUD 1 each by Intrauterine route once.    Marland Kitchen levothyroxine (SYNTHROID, LEVOTHROID) 100 MCG tablet Take 1 tablet (100 mcg total) by mouth daily. 90 tablet 3  . omeprazole (PRILOSEC) 20 MG capsule Take 20 mg by mouth daily.     No facility-administered medications prior to visit.      EXAM:  BP 92/62   Pulse 86   Temp (!) 97.5 F (36.4 C) (Oral)   Ht '5\' 4"'$  (1.626 m)   Wt 168 lb 12.8 oz (76.6 kg)   SpO2 98%   BMI 28.97 kg/m   Body mass index is 28.97 kg/m. Wt Readings from Last 3 Encounters:  08/19/17 168 lb 12.8 oz (76.6 kg)  07/24/17 167 lb (75.8 kg)  03/01/17 166 lb 1.6 oz (75.3 kg)    Physical Exam: Vital signs reviewed JJH:ERDE is a well-developed well-nourished alert cooperative    who appearsr stated age in no acute distress.  HEENT: normocephalic atraumatic , Eyes: PERRL EOM's  full, conjunctiva clear, Nares: paten,t no deformity discharge or tenderness., Ears: no deformity EAC's clear TMs with normal landmarks. Mouth: clear OP, no lesions, edema.  Low lying  Palate  Moist mucous membranes. Dentition in adequate repair. NECK: supple without masses,or bruits. CHEST/PULM:  Clear to auscultation and percussion breath sounds equal no wheeze , rales or rhonchi. No chest wall deformities or tenderness. Breast: normal by inspection . No dimpling, discharge, masses, tenderness or discharge . CV: PMI is nondisplaced, S1 S2 no gallops, murmurs, rubs. Peripheral pulses are full without delay.No JVD .  ABDOMEN: Bowel sounds normal nontender  No guard or rebound, no hepato splenomegal no CVA tenderness.  No hernia. Extremtities:  No clubbing cyanosis or edema, no acute joint swelling or redness no focal atrophy NEURO:  Oriented x3, cranial nerves 3-12 appear to be intact, no obvious focal weakness,gait within normal limits no abnormal reflexes or asymmetrical SKIN: No acute rashes normal turgor, color, no bruising or petechiae. PSYCH: Oriented, good eye contact, no obvious depression anxiety, cognition and judgment appear normal. LN: no cervical axillary      BP Readings from Last 3 Encounters:  08/19/17 92/62  07/24/17 98/62  03/01/17 110/88   Wt Readings from Last 3 Encounters:  08/19/17 168 lb 12.8 oz (76.6 kg)  07/24/17 167 lb (75.8 kg)  03/01/17 166 lb 1.6 oz (75.3 kg)    Lab is NOT FASTING TODAY   ASSESSMENT AND PLAN:  Discussed the following assessment and plan:  Visit for preventive health examination - Plan: Basic metabolic panel, CBC with Differential/Platelet, Hepatic function panel, Lipid panel, TSH  Hypothyroidism, unspecified type - Plan: Basic metabolic panel, CBC with Differential/Platelet, Hepatic function panel, Lipid panel, TSH  Hyperlipidemia, unspecified hyperlipidemia type - Plan: Basic metabolic panel, CBC with Differential/Platelet, Hepatic  function panel, Lipid panel, TSH  Need for immunization against influenza - Plan: Flu Vaccine QUAD 36+ mos IM  Gastroesophageal reflux disease, esophagitis presence not specified - Plan: Basic metabolic panel, CBC with Differential/Platelet, Hepatic function panel, Lipid panel, TSH  Medication management - Plan: Basic metabolic panel, CBC with Differential/Platelet, Hepatic function panel, Lipid panel, TSH   Expectant management. About knee pain   Avoid sqat's walking g ok  Fur if recurrences  Patient Care Team: Burnis Medin, MD as PCP - General  Everett Graff, MD (Obstetrics and Gynecology) Luberta Mutter, MD (Ophthalmology) dermatologist Patient Instructions  Continue lifestyle intervention healthy eating and exercise . After   A  4-6 week  Of the  Omeprazole  Wean  To evey other day alt with ranitidine   And off and see how you do.  And can take as needed .\let us know if  Getting worse again .    Take equiv vit d 800- 1000 iu per day  yusually in the calcium supplement     Preventive Care 40-64 Years, Female Preventive care refers to lifestyle choices and visits with your health care provider that can promote health and wellness. What does preventive care include?  A yearly physical exam. This is also called an annual well check.  Dental exams once or twice a year.  Routine eye exams. Ask your health care provider how often you should have your eyes checked.  Personal lifestyle choices, including: ? Daily care of your teeth and gums. ? Regular physical activity. ? Eating a healthy diet. ? Avoiding tobacco and drug use. ? Limiting alcohol use. ? Practicing safe sex. ? Taking low-dose aspirin daily starting at age 34. ? Taking vitamin and mineral supplements as recommended by your health care provider. What happens during an annual well check? The services and screenings done by your health care provider during your annual well check will depend on your age,  overall health, lifestyle risk factors, and family history of disease. Counseling Your health care provider may ask you questions about your:  Alcohol use.  Tobacco use.  Drug use.  Emotional well-being.  Home and relationship well-being.  Sexual activity.  Eating habits.  Work and work Statistician.  Method of birth control.  Menstrual cycle.  Pregnancy history.  Screening You may have the following tests or measurements:  Height, weight, and BMI.  Blood pressure.  Lipid and cholesterol levels. These may be checked every 5 years, or more frequently if you are over 73 years old.  Skin check.  Lung cancer screening. You may have this screening every year starting at age 22 if you have a 30-pack-year history of smoking and currently smoke or have quit within the past 15 years.  Fecal occult blood test (FOBT) of the stool. You may have this test every year starting at age 46.  Flexible sigmoidoscopy or colonoscopy. You may have a sigmoidoscopy every 5 years or a colonoscopy every 10 years starting at age 29.  Hepatitis C blood test.  Hepatitis B blood test.  Sexually transmitted disease (STD) testing.  Diabetes screening. This is done by checking your blood sugar (glucose) after you have not eaten for a while (fasting). You may have this done every 1-3 years.  Mammogram. This may be done every 1-2 years. Talk to your health care provider about when you should start having regular mammograms. This may depend on whether you have a family history of breast cancer.  BRCA-related cancer screening. This may be done if you have a family history of breast, ovarian, tubal, or peritoneal cancers.  Pelvic exam and Pap test. This may be done every 3 years starting at age 30. Starting at age 58, this may be done every 5 years if you have a Pap test in combination with an HPV test.  Bone density scan. This is done to screen for osteoporosis. You may have this scan if you are at  high risk for osteoporosis.  Discuss your test results, treatment options, and if necessary,  the need for more tests with your health care provider. Vaccines Your health care provider may recommend certain vaccines, such as:  Influenza vaccine. This is recommended every year.  Tetanus, diphtheria, and acellular pertussis (Tdap, Td) vaccine. You may need a Td booster every 10 years.  Varicella vaccine. You may need this if you have not been vaccinated.  Zoster vaccine. You may need this after age 8.  Measles, mumps, and rubella (MMR) vaccine. You may need at least one dose of MMR if you were born in 1957 or later. You may also need a second dose.  Pneumococcal 13-valent conjugate (PCV13) vaccine. You may need this if you have certain conditions and were not previously vaccinated.  Pneumococcal polysaccharide (PPSV23) vaccine. You may need one or two doses if you smoke cigarettes or if you have certain conditions.  Meningococcal vaccine. You may need this if you have certain conditions.  Hepatitis A vaccine. You may need this if you have certain conditions or if you travel or work in places where you may be exposed to hepatitis A.  Hepatitis B vaccine. You may need this if you have certain conditions or if you travel or work in places where you may be exposed to hepatitis B.  Haemophilus influenzae type b (Hib) vaccine. You may need this if you have certain conditions.  Talk to your health care provider about which screenings and vaccines you need and how often you need them. This information is not intended to replace advice given to you by your health care provider. Make sure you discuss any questions you have with your health care provider. Document Released: 10/07/2015 Document Revised: 05/30/2016 Document Reviewed: 07/12/2015 Elsevier Interactive Patient Education  2017 [employer]. Tejon Gracie M.D.

## 2017-08-19 ENCOUNTER — Ambulatory Visit (INDEPENDENT_AMBULATORY_CARE_PROVIDER_SITE_OTHER): Payer: PRIVATE HEALTH INSURANCE | Admitting: Internal Medicine

## 2017-08-19 ENCOUNTER — Encounter: Payer: Self-pay | Admitting: Internal Medicine

## 2017-08-19 VITALS — BP 92/62 | HR 86 | Temp 97.5°F | Ht 64.0 in | Wt 168.8 lb

## 2017-08-19 DIAGNOSIS — K219 Gastro-esophageal reflux disease without esophagitis: Secondary | ICD-10-CM

## 2017-08-19 DIAGNOSIS — Z23 Encounter for immunization: Secondary | ICD-10-CM | POA: Diagnosis not present

## 2017-08-19 DIAGNOSIS — Z79899 Other long term (current) drug therapy: Secondary | ICD-10-CM | POA: Diagnosis not present

## 2017-08-19 DIAGNOSIS — Z0000 Encounter for general adult medical examination without abnormal findings: Secondary | ICD-10-CM | POA: Diagnosis not present

## 2017-08-19 DIAGNOSIS — E785 Hyperlipidemia, unspecified: Secondary | ICD-10-CM

## 2017-08-19 DIAGNOSIS — E039 Hypothyroidism, unspecified: Secondary | ICD-10-CM

## 2017-08-19 LAB — LIPID PANEL
Cholesterol: 185 mg/dL (ref 0–200)
HDL: 36.2 mg/dL — AB (ref >39.00)
LDL CALC: 117 mg/dL — AB (ref 0–99)
NONHDL: 148.86
TRIGLYCERIDES: 157 mg/dL — AB (ref 0.0–149.0)
Total CHOL/HDL Ratio: 5
VLDL: 31.4 mg/dL (ref 0.0–40.0)

## 2017-08-19 LAB — HEPATIC FUNCTION PANEL
ALK PHOS: 59 U/L (ref 39–117)
ALT: 12 U/L (ref 0–35)
AST: 13 U/L (ref 0–37)
Albumin: 4.2 g/dL (ref 3.5–5.2)
BILIRUBIN DIRECT: 0.1 mg/dL (ref 0.0–0.3)
BILIRUBIN TOTAL: 0.4 mg/dL (ref 0.2–1.2)
TOTAL PROTEIN: 7 g/dL (ref 6.0–8.3)

## 2017-08-19 LAB — TSH: TSH: 1.53 u[IU]/mL (ref 0.35–4.50)

## 2017-08-19 LAB — BASIC METABOLIC PANEL
BUN: 13 mg/dL (ref 6–23)
CHLORIDE: 104 meq/L (ref 96–112)
CO2: 29 mEq/L (ref 19–32)
CREATININE: 0.94 mg/dL (ref 0.40–1.20)
Calcium: 9.5 mg/dL (ref 8.4–10.5)
GFR: 67.72 mL/min (ref >60.00)
Glucose, Bld: 86 mg/dL (ref 70–99)
Potassium: 4.1 mEq/L (ref 3.5–5.1)
Sodium: 140 mEq/L (ref 135–145)

## 2017-08-19 LAB — CBC WITH DIFFERENTIAL/PLATELET
BASOS ABS: 0 10*3/uL (ref 0.0–0.1)
BASOS PCT: 0.2 % (ref 0.0–3.0)
EOS ABS: 0.2 10*3/uL (ref 0.0–0.7)
Eosinophils Relative: 3.1 % (ref 0.0–5.0)
HEMATOCRIT: 39.2 % (ref 36.0–46.0)
HEMOGLOBIN: 13.1 g/dL (ref 12.0–15.0)
LYMPHS PCT: 33.2 % (ref 12.0–46.0)
Lymphs Abs: 2.3 10*3/uL (ref 0.7–4.0)
MCHC: 33.3 g/dL (ref 30.0–36.0)
MCV: 90.4 fl (ref 78.0–100.0)
MONO ABS: 0.4 10*3/uL (ref 0.1–1.0)
Monocytes Relative: 5.5 % (ref 3.0–12.0)
Neutro Abs: 4 10*3/uL (ref 1.4–7.7)
Neutrophils Relative %: 58 % (ref 43.0–77.0)
PLATELETS: 187 10*3/uL (ref 150.0–400.0)
RBC: 4.33 Mil/uL (ref 3.87–5.11)
RDW: 12.7 % (ref 11.5–15.5)
WBC: 6.9 10*3/uL (ref 4.0–10.5)

## 2017-08-19 NOTE — Patient Instructions (Signed)
Continue lifestyle intervention healthy eating and exercise . After   A  4-6 week  Of the  Omeprazole  Wean  To evey other day alt with ranitidine   And off and see how you do.  And can take as needed .\let us know if  Getting worse again .    Take equiv vit d 800- 1000 iu per day  yusually in the calcium supplement     Preventive Care 40-64 Years, Female Preventive care refers to lifestyle choices and visits with your health care provider that can promote health and wellness. What does preventive care include?  A yearly physical exam. This is also called an annual well check.  Dental exams once or twice a year.  Routine eye exams. Ask your health care provider how often you should have your eyes checked.  Personal lifestyle choices, including: ? Daily care of your teeth and gums. ? Regular physical activity. ? Eating a healthy diet. ? Avoiding tobacco and drug use. ? Limiting alcohol use. ? Practicing safe sex. ? Taking low-dose aspirin daily starting at age 21. ? Taking vitamin and mineral supplements as recommended by your health care provider. What happens during an annual well check? The services and screenings done by your health care provider during your annual well check will depend on your age, overall health, lifestyle risk factors, and family history of disease. Counseling Your health care provider may ask you questions about your:  Alcohol use.  Tobacco use.  Drug use.  Emotional well-being.  Home and relationship well-being.  Sexual activity.  Eating habits.  Work and work Statistician.  Method of birth control.  Menstrual cycle.  Pregnancy history.  Screening You may have the following tests or measurements:  Height, weight, and BMI.  Blood pressure.  Lipid and cholesterol levels. These may be checked every 5 years, or more frequently if you are over 36 years old.  Skin check.  Lung cancer screening. You may have this screening every year  starting at age 15 if you have a 30-pack-year history of smoking and currently smoke or have quit within the past 15 years.  Fecal occult blood test (FOBT) of the stool. You may have this test every year starting at age 89.  Flexible sigmoidoscopy or colonoscopy. You may have a sigmoidoscopy every 5 years or a colonoscopy every 10 years starting at age 65.  Hepatitis C blood test.  Hepatitis B blood test.  Sexually transmitted disease (STD) testing.  Diabetes screening. This is done by checking your blood sugar (glucose) after you have not eaten for a while (fasting). You may have this done every 1-3 years.  Mammogram. This may be done every 1-2 years. Talk to your health care provider about when you should start having regular mammograms. This may depend on whether you have a family history of breast cancer.  BRCA-related cancer screening. This may be done if you have a family history of breast, ovarian, tubal, or peritoneal cancers.  Pelvic exam and Pap test. This may be done every 3 years starting at age 25. Starting at age 56, this may be done every 5 years if you have a Pap test in combination with an HPV test.  Bone density scan. This is done to screen for osteoporosis. You may have this scan if you are at high risk for osteoporosis.  Discuss your test results, treatment options, and if necessary, the need for more tests with your health care provider. Vaccines Your health care provider  may recommend certain vaccines, such as:  Influenza vaccine. This is recommended every year.  Tetanus, diphtheria, and acellular pertussis (Tdap, Td) vaccine. You may need a Td booster every 10 years.  Varicella vaccine. You may need this if you have not been vaccinated.  Zoster vaccine. You may need this after age 107.  Measles, mumps, and rubella (MMR) vaccine. You may need at least one dose of MMR if you were born in 1957 or later. You may also need a second dose.  Pneumococcal 13-valent  conjugate (PCV13) vaccine. You may need this if you have certain conditions and were not previously vaccinated.  Pneumococcal polysaccharide (PPSV23) vaccine. You may need one or two doses if you smoke cigarettes or if you have certain conditions.  Meningococcal vaccine. You may need this if you have certain conditions.  Hepatitis A vaccine. You may need this if you have certain conditions or if you travel or work in places where you may be exposed to hepatitis A.  Hepatitis B vaccine. You may need this if you have certain conditions or if you travel or work in places where you may be exposed to hepatitis B.  Haemophilus influenzae type b (Hib) vaccine. You may need this if you have certain conditions.  Talk to your health care provider about which screenings and vaccines you need and how often you need them. This information is not intended to replace advice given to you by your health care provider. Make sure you discuss any questions you have with your health care provider. Document Released: 10/07/2015 Document Revised: 05/30/2016 Document Reviewed: 07/12/2015 Elsevier Interactive Patient Education  2017 [employer].

## 2017-08-30 ENCOUNTER — Other Ambulatory Visit: Payer: Self-pay | Admitting: Internal Medicine

## 2018-03-31 ENCOUNTER — Other Ambulatory Visit: Payer: Self-pay | Admitting: Internal Medicine

## 2018-06-29 ENCOUNTER — Other Ambulatory Visit: Payer: Self-pay | Admitting: Internal Medicine

## 2018-07-14 ENCOUNTER — Ambulatory Visit: Payer: Self-pay

## 2018-07-14 NOTE — Telephone Encounter (Signed)
Pt c/o 1 week of feeling "swimmy headed". Pt denies vertigo. Pt stated the lightheadedness was mild in severity. Standing makes the symptoms worse. Pt c/o mild nausea. Pt thinks it could be ear or sinus related.  She stated that she felt her ears pop and felt better. Pt has never been lightheaded before. Pt denies fever, chest pain, vomiting, diarrhea or bleeding.  Care advice given and pt verbalized understanding. Pt given appointment for 9:45 pm Wednesday with PCP.  Reason for Disposition . [1] MILD dizziness (e.g., walking normally) AND [2] has NOT been evaluated by physician for this  (Exception: dizziness caused by heat exposure, sudden standing, or poor fluid intake)  Answer Assessment - Initial Assessment Questions 1. DESCRIPTION: "Describe your dizziness."     Feels like swimmy headed 2. LIGHTHEADED: "Do you feel lightheaded?" (e.g., somewhat faint, woozy, weak upon standing)     yes 3. VERTIGO: "Do you feel like either you or the room is spinning or tilting?" (i.e. vertigo)     no 4. SEVERITY: "How bad is it?"  "Do you feel like you are going to faint?" "Can you stand and walk?"   - MILD - walking normally   - MODERATE - interferes with normal activities (e.g., work, school)    - SEVERE - unable to stand, requires support to walk, feels like passing out now.      mild 5. ONSET:  "When did the dizziness begin?"     1 week ago 6. AGGRAVATING FACTORS: "Does anything make it worse?" (e.g., standing, change in head position)     standing 7. HEART RATE: "Can you tell me your heart rate?" "How many beats in 15 seconds?"  (Note: not all patients can do this)       80 per minute 8. CAUSE: "What do you think is causing the dizziness?"     Pt doesn't know- thinks may be ear related or sinus related 9. RECURRENT SYMPTOM: "Have you had dizziness before?" If so, ask: "When was the last time?" "What happened that time?"     no 10. OTHER SYMPTOMS: "Do you have any other symptoms?" (e.g., fever,  chest pain, vomiting, diarrhea, bleeding)       Nausea -mild  11. PREGNANCY: "Is there any chance you are pregnant?" "When was your last menstrual period?"       N/a Has IUD  Protocols used: DIZZINESS [facility]

## 2018-07-15 NOTE — Progress Notes (Signed)
Chief Complaint  Patient presents with  . Dizziness    x 2 weeks    HPI: Lisa Bradford 47 y.o. come in for problem   Based visit  Sent in by nurse triage from 10 20 for dizziness  Poss sinus problem  About 2 weeks  fooo and on  Flew to [place] and began after got back .  But continues  Never had this before    Hard to pop on flight down and hhad a cold last week.  This past Monday and now a slight better .     had diarrhea  Short time  Last week had congestion .  Had face pain last week.  ROS: See pertinent positives and negatives per HPI.  no fever  Pain  Neuro sx   Past Medical History:  Diagnosis Date  . Allergy   . Heart murmur    echo in 98  . Hypothyroidism    pos antibodies  goiter     Family History  Problem Relation Age of Onset  . Cancer Mother        ovarian and cervical  . Cancer Maternal Grandmother        lung  . Cancer Maternal Grandfather        lung and pancreatic  . Coronary artery disease Father        stent    Social History   Socioeconomic History  . Marital status: Single    Spouse name: Not on file  . Number of children: Not on file  . Years of education: Not on file  . Highest education level: Not on file  Occupational History  . Not on file  Social Needs  . Financial resource strain: Not on file  . Food insecurity:    Worry: Not on file    Inability: Not on file  . Transportation needs:    Medical: Not on file    Non-medical: Not on file  Tobacco Use  . Smoking status: Never Smoker  . Smokeless tobacco: Never Used  Substance and Sexual Activity  . Alcohol use: Yes    Alcohol/week: 0.0 standard drinks    Comment: once a month  . Drug use: No  . Sexual activity: Never    Birth control/protection: IUD  Lifestyle  . Physical activity:    Days per week: Not on file    Minutes per session: Not on file  . Stress: Not on file  Relationships  . Social connections:    Talks on phone: Not on file    Gets together: Not on  file    Attends religious service: Not on file    Active member of club or organization: Not on file    Attends meetings of clubs or organizations: Not on file    Relationship status: Not on file  Other Topics Concern  . Not on file  Social History Narrative   HH of 1   Pet cat.   ocass exercise .   No tob    rare etoh   caffiene  2 per day   Work  40 hours per week.    Good sleep.     Outpatient Medications Prior to Visit  Medication Sig Dispense Refill  . fluticasone (FLONASE) 50 MCG/ACT nasal spray INSTILL 2 SPRAYS IN EACH NOSTRIL EVERY DAY AS NEEDED FOR ALLERGIES AND SINUSITIS 48 g 3  . levonorgestrel (MIRENA) 20 MCG/24HR IUD 1 each by Intrauterine route once.    Marland Kitchen  levothyroxine (SYNTHROID, LEVOTHROID) 100 MCG tablet TAKE 1 TABLET(100 MCG) BY MOUTH DAILY 90 tablet 0  . omeprazole (PRILOSEC) 20 MG capsule Take 20 mg by mouth daily.     No facility-administered medications prior to visit.      EXAM:  BP 108/64 (BP Location: Left Arm, Patient Position: Sitting, Cuff Size: Normal)   Pulse 77   Temp 98.1 F (36.7 C) (Oral)   Wt 167 lb 8 oz (76 kg)   SpO2 94%   BMI 28.75 kg/m   Body mass index is 28.75 kg/m.  GENERAL: vitals reviewed and listed above, alert, oriented, appears well hydrated and in no acute distress   Mild congestion   HEENT: atraumatic, conjunctiva  clear, no obvious abnormalities on inspection of external nose and ears tm nad  nasres congestion face nontender OP : no lesion edema or exudate  NECK: no obvious masses on inspection palpation  LUNGS: clear to auscultation bilaterally, no wheezes, rales or rhonchi, good air movement CV: HRRR, no clubbing cyanosis or  peripheral edema nl cap refill  NEURO: oriented x 3 CN 3-12 appear intact. No focal muscle weakness or atrophy. DTRs symmetrical. Gait WNL.  Grossly non focal. No tremor or abnormal movement. MS: moves all extremities without noticeable focal  abnormality PSYCH: pleasant and cooperative, no  obvious depression or anxiety Lab Results  Component Value Date   WBC 6.9 08/19/2017   HGB 13.1 08/19/2017   HCT 39.2 08/19/2017   PLT 187.0 08/19/2017   GLUCOSE 86 08/19/2017   CHOL 185 08/19/2017   TRIG 157.0 (H) 08/19/2017   HDL 36.20 (L) 08/19/2017   LDLDIRECT 96.0 08/08/2016   LDLCALC 117 (H) 08/19/2017   ALT 12 08/19/2017   AST 13 08/19/2017   NA 140 08/19/2017   K 4.1 08/19/2017   CL 104 08/19/2017   CREATININE 0.94 08/19/2017   BUN 13 08/19/2017   CO2 29 08/19/2017   TSH 1.53 08/19/2017   BP Readings from Last 3 Encounters:  07/16/18 108/64  08/19/17 92/62  07/24/17 98/62    ASSESSMENT AND PLAN:  Discussed the following assessment and plan:  Episodes of dizziness - positional  acts like  motion vertig reassuring exam and hx   Congestion of respiratory tract - prob viral uri and recent  plane flihgts   Need for immunization against influenza - Plan: Flu Vaccine QUAD 36+ mos IM  -Patient advised to return or notify health care team  if  new concerns arise.  Patient Instructions  Seems like a mini  version of positional vertigo related to ear and inner ear .  That I thinks will  Get better with time  And when congestion is gone  Your exam is reassuring and dont see neurologic or cardiac cause of sx    Suspect it is related to the plane llfights and barometric pressure changes and  Congestion now.   Saline nose spray flonase    Decongestant if helps you  Decrease pressures  Avoid  Ladders  Positional changes  Until better .   Get back with [place] if  persistent or progressive of alarming concerns    Neta Mends. Osborn Pullin M.D.

## 2018-07-16 ENCOUNTER — Encounter: Payer: Self-pay | Admitting: Internal Medicine

## 2018-07-16 ENCOUNTER — Ambulatory Visit (INDEPENDENT_AMBULATORY_CARE_PROVIDER_SITE_OTHER): Payer: PRIVATE HEALTH INSURANCE | Admitting: Internal Medicine

## 2018-07-16 VITALS — BP 108/64 | HR 77 | Temp 98.1°F | Wt 167.5 lb

## 2018-07-16 DIAGNOSIS — J988 Other specified respiratory disorders: Secondary | ICD-10-CM

## 2018-07-16 DIAGNOSIS — Z23 Encounter for immunization: Secondary | ICD-10-CM

## 2018-07-16 DIAGNOSIS — R42 Dizziness and giddiness: Secondary | ICD-10-CM | POA: Diagnosis not present

## 2018-07-16 NOTE — Patient Instructions (Addendum)
Seems like a mini  version of positional vertigo related to ear and inner ear .  That I thinks will  Get better with time  And when congestion is gone  Your exam is reassuring and dont see neurologic or cardiac cause of sx    Suspect it is related to the plane llfights and barometric pressure changes and  Congestion now.   Saline nose spray flonase    Decongestant if helps you  Decrease pressures  Avoid  Ladders  Positional changes  Until better .   Get back with [place] if  persistent or progressive of alarming concerns

## 2018-09-22 LAB — HM PAP SMEAR

## 2018-10-02 ENCOUNTER — Other Ambulatory Visit: Payer: Self-pay | Admitting: Internal Medicine

## 2018-10-08 ENCOUNTER — Encounter: Payer: Self-pay | Admitting: Internal Medicine

## 2018-10-31 NOTE — Progress Notes (Signed)
Chief Complaint  Patient presents with  . Follow-up    medicaiton refill. Pt has a feeling that her thyroid levels will be off and would liked them check    HPI: Patient  Lisa Bradford  48 y.o. comes in today for yearly   Med management  visit   ?  Hair seems drier and some dryness and skin thinks her thyroid should be rechecked like when it was off before.  No change in medication     Still having periods.  obgyne  Dr Su Hiltoberts.  No problems   End of December . Does  Check us and exam  Takes omeprazole  dialy  Gets epigast pain if  Off for 3 days or so . Using coconut milk and her protein shakes thought the [place] milk made her thyroid off. Overall doing pretty well.   Health Maintenance  Topic Date Due  . HIV Screening  03/20/1985  . PAP SMEAR-Modifier  09/22/2021  . TETANUS/TDAP  04/01/2022  . INFLUENZA VACCINE  Completed   Health Maintenance Review LIFESTYLE:  Exercise:  Not much  Tobacco/ETS: no Alcohol:  Wine icass  Sugar beverages: Sleep: off and on   Drug use: no HH of 1  No pets  Work: 40 horus    ROS:  GEN/ HEENT: No fever, significant weight changes sweats headaches vision problems hearing changes, CV/ PULM; No chest pain shortness of breath cough, syncope,edema  change in exercise tolerance. GI /GU: No adominal pain, vomiting, change in bowel habits. No blood in the stool. No significant GU symptoms. SKIN/HEME: ,no acute skin rashes suspicious lesions or bleeding. No lymphadenopathy, nodules, masses.  NEURO/ PSYCH:  No neurologic signs such as weakness numbness. No depression anxiety. IMM/ Allergy: No unusual infections.  Allergy .   REST of 12 system review negative except as per HPI   Past Medical History:  Diagnosis Date  . Allergy   . Heart murmur    echo in 98  . Hypothyroidism    pos antibodies  goiter     Past Surgical History:  Procedure Laterality Date  . CERVICAL POLYPECTOMY      Family History  Problem Relation Age of Onset    . Cancer Mother        ovarian and cervical  . Cancer Maternal Grandmother        lung  . Cancer Maternal Grandfather        lung and pancreatic  . Coronary artery disease Father        stent    Social History   Socioeconomic History  . Marital status: Single    Spouse name: Not on file  . Number of children: Not on file  . Years of education: Not on file  . Highest education level: Not on file  Occupational History  . Not on file  Social Needs  . Financial resource strain: Not on file  . Food insecurity:    Worry: Not on file    Inability: Not on file  . Transportation needs:    Medical: Not on file    Non-medical: Not on file  Tobacco Use  . Smoking status: Never Smoker  . Smokeless tobacco: Never Used  Substance and Sexual Activity  . Alcohol use: Yes    Alcohol/week: 0.0 standard drinks    Comment: once a month  . Drug use: No  . Sexual activity: Never    Birth control/protection: I.U.D.  Lifestyle  . Physical activity:  Days per week: Not on file    Minutes per session: Not on file  . Stress: Not on file  Relationships  . Social connections:    Talks on phone: Not on file    Gets together: Not on file    Attends religious service: Not on file    Active member of club or organization: Not on file    Attends meetings of clubs or organizations: Not on file    Relationship status: Not on file  Other Topics Concern  . Not on file  Social History Narrative   HH of 1   Pet cat.   ocass exercise .   No tob    rare etoh   caffiene  2 per day   Work  40 hours per week.    Good sleep.     Outpatient Medications Prior to Visit  Medication Sig Dispense Refill  . fluticasone (FLONASE) 50 MCG/ACT nasal spray INSTILL 2 SPRAYS IN EACH NOSTRIL EVERY DAY AS NEEDED FOR ALLERGIES AND SINUSITIS 48 g 3  . levonorgestrel (MIRENA) 20 MCG/24HR IUD 1 each by Intrauterine route once.    Marland Kitchen. levothyroxine (SYNTHROID, LEVOTHROID) 100 MCG tablet TAKE 1 TABLET(100 MCG) BY  MOUTH DAILY 90 tablet 0  . omeprazole (PRILOSEC) 20 MG capsule Take 20 mg by mouth daily.     No facility-administered medications prior to visit.      EXAM:  BP 118/62 (BP Location: Right Arm, Patient Position: Sitting, Cuff Size: Large)   Pulse 71   Temp 98.7 F (37.1 C) (Oral)   Wt 164 lb 11.2 oz (74.7 kg)   BMI 28.27 kg/m   Body mass index is 28.27 kg/m. Wt Readings from Last 3 Encounters:  11/03/18 164 lb 11.2 oz (74.7 kg)  07/16/18 167 lb 8 oz (76 kg)  08/19/17 168 lb 12.8 oz (76.6 kg)    Physical Exam: Vital signs reviewed NFA:OZHYGEN:This is a well-developed well-nourished alert cooperative    who appearsr stated age in no acute distress.  HEENT: normocephalic atraumatic , Eyes: PERRL EOM's full, conjunctiva clear, Nares: paten,t no deformity discharge or tenderness.,  Mouth: clear OP, no lesions, edema.  Moist mucous membranes. Dentition in adequate repair. NECK: supple without masses, thyrpalpable   no or bruits. CHEST/PULM:  Clear to auscultation and percussion breath sounds equal no wheeze , rales or rhonchi. CV: PMI is nondisplaced, S1 S2 no gallops, murmurs, rubs. Peripheral pulses are full without delay.No JVD .  ABDOMEN: Bowel sounds normal nontender  No guard or rebound, no hepato splenomegal no CVA tenderness.  Extremtities:  No clubbing cyanosis or edema, no acute joint swelling or redness no focal atrophy NEURO:  Oriented x3, cranial nerves 3-12 appear to be intact, no obvious focal weakness,gait within normal limits no abnormal reflexes or asymmetrical SKIN: No acute rashes normal turgor, color, no bruising or petechiae. Hair colored  PSYCH: Oriented, good eye contact, no obvious depression anxiety, cognition and judgment appear normal. LN: no cervical  adenopathy    BP Readings from Last 3 Encounters:  11/03/18 118/62  07/16/18 108/64  08/19/17 92/62    Lab plan  reviewed with patient   nf   ASSESSMENT AND PLAN:  Discussed the following assessment and  plan:  Hypothyroidism, unspecified type - Plan: Basic metabolic panel, Lipid panel, TSH, T4, free  Medication management - Plan: Basic metabolic panel, Lipid panel, TSH, T4, free  Hyperlipidemia, unspecified hyperlipidemia type - Plan: Basic metabolic panel, Lipid panel, TSH, T4, free  Dry  hair  Had protein shake this am coconut and protein this am  Advise  Trial dec omeprazole and take pepcid instead if needed minimal does needs  Hard changes could be from   Pre menopausal  hormone changes   NF lab today  Total visit > 50% spent counseling and coordinating care as indicated in above note and in instructions to patient .    Patient Care Team: Harrington Jobe, Neta Mends, MD as PCP - Karlton Lemon, MD (Obstetrics and Gynecology) Maris Berger, MD (Ophthalmology) dermatologist Patient Instructions  Will notify you  of labs when available. And if ok will refill medicatoin.  Make sure getting equivalent of VIt d 800 - 1000 iu per day in food and or suppl.    Continue lifestyle intervention healthy eating and exercise .  If  All ok then  cpx in a year or as needed.   Sometimes  hair  changes can be hormonal from menopause process . Etc .    Neta Mends. Addalynne Golding M.D.

## 2018-11-03 ENCOUNTER — Ambulatory Visit (INDEPENDENT_AMBULATORY_CARE_PROVIDER_SITE_OTHER): Payer: PRIVATE HEALTH INSURANCE | Admitting: Internal Medicine

## 2018-11-03 ENCOUNTER — Encounter: Payer: Self-pay | Admitting: Internal Medicine

## 2018-11-03 ENCOUNTER — Other Ambulatory Visit: Payer: Self-pay | Admitting: Internal Medicine

## 2018-11-03 VITALS — BP 118/62 | HR 71 | Temp 98.7°F | Wt 164.7 lb

## 2018-11-03 DIAGNOSIS — L678 Other hair color and hair shaft abnormalities: Secondary | ICD-10-CM

## 2018-11-03 DIAGNOSIS — Z79899 Other long term (current) drug therapy: Secondary | ICD-10-CM | POA: Diagnosis not present

## 2018-11-03 DIAGNOSIS — E039 Hypothyroidism, unspecified: Secondary | ICD-10-CM | POA: Diagnosis not present

## 2018-11-03 DIAGNOSIS — E785 Hyperlipidemia, unspecified: Secondary | ICD-10-CM

## 2018-11-03 LAB — BASIC METABOLIC PANEL
BUN: 22 mg/dL (ref 6–23)
CHLORIDE: 103 meq/L (ref 96–112)
CO2: 29 mEq/L (ref 19–32)
CREATININE: 0.92 mg/dL (ref 0.40–1.20)
Calcium: 9.4 mg/dL (ref 8.4–10.5)
GFR: 64.98 mL/min (ref >60.00)
Glucose, Bld: 77 mg/dL (ref 70–99)
Potassium: 4.8 mEq/L (ref 3.5–5.1)
Sodium: 140 mEq/L (ref 135–145)

## 2018-11-03 LAB — LIPID PANEL
CHOLESTEROL: 179 mg/dL (ref 0–200)
HDL: 41.4 mg/dL (ref >39.00)
LDL CALC: 116 mg/dL — AB (ref 0–99)
NonHDL: 137.67
TRIGLYCERIDES: 106 mg/dL (ref 0.0–149.0)
Total CHOL/HDL Ratio: 4
VLDL: 21.2 mg/dL (ref 0.0–40.0)

## 2018-11-03 LAB — T4, FREE: FREE T4: 1.22 ng/dL (ref 0.60–1.60)

## 2018-11-03 LAB — TSH: TSH: 0.9 u[IU]/mL (ref 0.35–4.50)

## 2018-11-03 MED ORDER — LEVOTHYROXINE SODIUM 100 MCG PO TABS: ORAL_TABLET | ORAL | 3 refills | Status: DC

## 2018-11-03 NOTE — Patient Instructions (Signed)
Will notify you  of labs when available. And if ok will refill medicatoin.  Make sure getting equivalent of VIt d 800 - 1000 iu per day in food and or suppl.    Continue lifestyle intervention healthy eating and exercise .  If  All ok then  cpx in a year or as needed.   Sometimes  hair  changes can be hormonal from menopause process . Etc .

## 2019-04-08 ENCOUNTER — Other Ambulatory Visit (HOSPITAL_COMMUNITY): Payer: Self-pay | Admitting: *Deleted

## 2019-04-08 DIAGNOSIS — N6310 Unspecified lump in the right breast, unspecified quadrant: Secondary | ICD-10-CM

## 2019-04-10 ENCOUNTER — Other Ambulatory Visit: Payer: Self-pay | Admitting: Obstetrics and Gynecology

## 2019-04-10 DIAGNOSIS — N6310 Unspecified lump in the right breast, unspecified quadrant: Secondary | ICD-10-CM

## 2019-04-14 ENCOUNTER — Other Ambulatory Visit: Payer: No Typology Code available for payment source

## 2019-05-05 ENCOUNTER — Encounter (HOSPITAL_COMMUNITY): Payer: Self-pay

## 2019-05-05 ENCOUNTER — Other Ambulatory Visit: Payer: Self-pay

## 2019-05-05 ENCOUNTER — Ambulatory Visit (HOSPITAL_COMMUNITY)
Admission: RE | Admit: 2019-05-05 | Discharge: 2019-05-05 | Disposition: A | Discharge status: Home / Self Care | Payer: No Typology Code available for payment source | Source: Ambulatory Visit | Attending: Obstetrics and Gynecology | Admitting: Obstetrics and Gynecology

## 2019-05-05 DIAGNOSIS — Z1239 Encounter for other screening for malignant neoplasm of breast: Secondary | ICD-10-CM | POA: Insufficient documentation

## 2019-05-05 DIAGNOSIS — N644 Mastodynia: Secondary | ICD-10-CM | POA: Insufficient documentation

## 2019-05-05 NOTE — Patient Instructions (Signed)
Explained breast self awareness with Blair Heys. Patient did not need a Pap smear today due to last Pap smear was 09/22/2018. Let her know [facility] will cover Pap smears every 3 years unless has a history of abnormal Pap smears. Referred patient to the [facility] for a diagnostic mammogram. Appointment scheduled for Wednesday, May 06, 2019 at 1400. Patient aware of appointment and will be there. [facility] verbalized understanding.  Brannock, Arvil Chaco, RN 3:34 PM

## 2019-05-05 NOTE — Progress Notes (Signed)
Complaints of a right breast lump x 7 weeks.  Pap Smear: Pap smear not completed today. Last Pap smear was 09/22/2018 at [facility] and normal. Per patient has a history of an abnormal Pap smear 10 or more years ago that a repeat Pap smear was completed for follow-up that was normal. Per patient all Pap smears have been normal since. Last Pap smear result is in Epic.  Physical exam: Breasts Breasts symmetrical. No skin abnormalities bilateral breasts. No nipple retraction bilateral breasts. No nipple discharge bilateral breasts. No lymphadenopathy. No lumps palpated bilateral breasts. Unable to palpated a lump in patients area of concern. Complaints of right inner breast tenderness on exam. Referred patient to the [facility] for a diagnostic mammogram. Appointment scheduled for Wednesday, May 06, 2019 at 1400.        Pelvic/Bimanual No Pap smear completed today since last Pap sm12/30/219.ear was 09/22/2018. Pap smear not indicated per [facility] guidelines.   Smoking History: Patient has never smoked.  Patient Navigation: Patient education provided. Access to services provided for patient through [facility] program.   Breast and Cervical Cancer Risk Assessment: Patient has a family history of her mother and a maternal aunt having breast cancer. patient has no known genetic mutations or history of radiation treatment to the chest before age 12. Patient has no history of cervical dysplasia, immunocompromised, or DES exposure in-utero.  Risk Assessment    Risk Scores      05/05/2019   Last edited by: Armond Hang, LPN   5-year risk: 1.7 %   Lifetime risk: 15.7 %

## 2019-05-06 ENCOUNTER — Ambulatory Visit
Admission: RE | Admit: 2019-05-06 | Discharge: 2019-05-06 | Disposition: A | Discharge status: Home / Self Care | Payer: No Typology Code available for payment source | Source: Ambulatory Visit | Attending: Obstetrics and Gynecology | Admitting: Obstetrics and Gynecology

## 2019-05-06 DIAGNOSIS — N6310 Unspecified lump in the right breast, unspecified quadrant: Secondary | ICD-10-CM

## 2019-05-12 ENCOUNTER — Encounter (HOSPITAL_COMMUNITY): Payer: Self-pay | Admitting: *Deleted

## 2019-10-09 ENCOUNTER — Other Ambulatory Visit: Payer: Self-pay

## 2019-10-09 ENCOUNTER — Ambulatory Visit (INDEPENDENT_AMBULATORY_CARE_PROVIDER_SITE_OTHER): Payer: PRIVATE HEALTH INSURANCE | Admitting: Internal Medicine

## 2019-10-09 ENCOUNTER — Encounter: Payer: Self-pay | Admitting: Internal Medicine

## 2019-10-09 VITALS — BP 118/62 | HR 67 | Temp 97.6°F | Ht 64.0 in | Wt 143.4 lb

## 2019-10-09 DIAGNOSIS — Z79899 Other long term (current) drug therapy: Secondary | ICD-10-CM | POA: Diagnosis not present

## 2019-10-09 DIAGNOSIS — E039 Hypothyroidism, unspecified: Secondary | ICD-10-CM

## 2019-10-09 DIAGNOSIS — N951 Menopausal and female climacteric states: Secondary | ICD-10-CM

## 2019-10-09 DIAGNOSIS — Z0000 Encounter for general adult medical examination without abnormal findings: Secondary | ICD-10-CM

## 2019-10-09 DIAGNOSIS — E785 Hyperlipidemia, unspecified: Secondary | ICD-10-CM | POA: Diagnosis not present

## 2019-10-09 DIAGNOSIS — Z23 Encounter for immunization: Secondary | ICD-10-CM | POA: Diagnosis not present

## 2019-10-09 DIAGNOSIS — N289 Disorder of kidney and ureter, unspecified: Secondary | ICD-10-CM

## 2019-10-09 LAB — HEPATIC FUNCTION PANEL
ALT: 18 U/L (ref 0–35)
AST: 18 U/L (ref 0–37)
Albumin: 4.4 g/dL (ref 3.5–5.2)
Alkaline Phosphatase: 58 U/L (ref 39–117)
Bilirubin, Direct: 0.1 mg/dL (ref 0.0–0.3)
Total Bilirubin: 0.5 mg/dL (ref 0.2–1.2)
Total Protein: 7.3 g/dL (ref 6.0–8.3)

## 2019-10-09 LAB — CBC WITH DIFFERENTIAL/PLATELET
Basophils Absolute: 0 10*3/uL (ref 0.0–0.1)
Basophils Relative: 0.5 % (ref 0.0–3.0)
Eosinophils Absolute: 0.1 10*3/uL (ref 0.0–0.7)
Eosinophils Relative: 1.1 % (ref 0.0–5.0)
HCT: 39.7 % (ref 36.0–46.0)
Hemoglobin: 13.3 g/dL (ref 12.0–15.0)
Lymphocytes Relative: 26.4 % (ref 12.0–46.0)
Lymphs Abs: 1.9 10*3/uL (ref 0.7–4.0)
MCHC: 33.4 g/dL (ref 30.0–36.0)
MCV: 90.8 fl (ref 78.0–100.0)
Monocytes Absolute: 0.4 10*3/uL (ref 0.1–1.0)
Monocytes Relative: 4.9 % (ref 3.0–12.0)
Neutro Abs: 4.8 10*3/uL (ref 1.4–7.7)
Neutrophils Relative %: 67.1 % (ref 43.0–77.0)
Platelets: 181 10*3/uL (ref 150.0–400.0)
RBC: 4.38 Mil/uL (ref 3.87–5.11)
RDW: 12.5 % (ref 11.5–15.5)
WBC: 7.1 10*3/uL (ref 4.0–10.5)

## 2019-10-09 LAB — LIPID PANEL
Cholesterol: 172 mg/dL (ref 0–200)
HDL: 50.4 mg/dL (ref >39.00)
LDL Cholesterol: 105 mg/dL — ABNORMAL HIGH (ref 0–99)
NonHDL: 121.69
Total CHOL/HDL Ratio: 3
Triglycerides: 84 mg/dL (ref 0.0–149.0)
VLDL: 16.8 mg/dL (ref 0.0–40.0)

## 2019-10-09 LAB — BASIC METABOLIC PANEL
BUN: 37 mg/dL — ABNORMAL HIGH (ref 6–23)
CO2: 29 mEq/L (ref 19–32)
Calcium: 9.8 mg/dL (ref 8.4–10.5)
Chloride: 101 mEq/L (ref 96–112)
Creatinine, Ser: 1.01 mg/dL (ref 0.40–1.20)
GFR: 58.12 mL/min — ABNORMAL LOW (ref >60.00)
Glucose, Bld: 57 mg/dL — ABNORMAL LOW (ref 70–99)
Potassium: 4.4 mEq/L (ref 3.5–5.1)
Sodium: 139 mEq/L (ref 135–145)

## 2019-10-09 LAB — TSH: TSH: 3.44 u[IU]/mL (ref 0.35–4.50)

## 2019-10-09 LAB — T4, FREE: Free T4: 0.94 ng/dL (ref 0.60–1.60)

## 2019-10-09 LAB — FOLLICLE STIMULATING HORMONE: FSH: 75.1 m[IU]/mL

## 2019-10-09 NOTE — Progress Notes (Signed)
Results show  fsh elevated   consistent with  menopausal state    thyroid is normal   range       kidney lab tests show  poss slight decrease but this could be from  dehydration with he fasting .    So   repeat bmp when well hydrated  .

## 2019-10-09 NOTE — Patient Instructions (Addendum)
Glad you are doing well  Hair changes could be from thyroid age genetics and perimenopause   Continue lifestyle intervention healthy eating and exercise . Will notify you  of labs when available.  Advise covid vaccine for all patients not allergic Check on  cologuard for colon cancer screening.   Preventive Care 8-50 Years Old, Female Preventive care refers to visits with your health care provider and lifestyle choices that can promote health and wellness. This includes:  A yearly physical exam. This may also be called an annual well check.  Regular dental visits and eye exams.  Immunizations.  Screening for certain conditions.  Healthy lifestyle choices, such as eating a healthy diet, getting regular exercise, not using drugs or products that contain nicotine and tobacco, and limiting alcohol use. What can I expect for my preventive care visit? Physical exam Your health care provider will check your:  Height and weight. This may be used to calculate body mass index (BMI), which tells if you are at a healthy weight.  Heart rate and blood pressure.  Skin for abnormal spots. Counseling Your health care provider may ask you questions about your:  Alcohol, tobacco, and drug use.  Emotional well-being.  Home and relationship well-being.  Sexual activity.  Eating habits.  Work and work Statistician.  Method of birth control.  Menstrual cycle.  Pregnancy history. What immunizations do I need?  Influenza (flu) vaccine  This is recommended every year. Tetanus, diphtheria, and pertussis (Tdap) vaccine  You may need a Td booster every 10 years. Varicella (chickenpox) vaccine  You may need this if you have not been vaccinated. Zoster (shingles) vaccine  You may need this after age 49. Measles, mumps, and rubella (MMR) vaccine  You may need at least one dose of MMR if you were born in 1957 or later. You may also need a second dose. Pneumococcal conjugate (PCV13)  vaccine  You may need this if you have certain conditions and were not previously vaccinated. Pneumococcal polysaccharide (PPSV23) vaccine  You may need one or two doses if you smoke cigarettes or if you have certain conditions. Meningococcal conjugate (MenACWY) vaccine  You may need this if you have certain conditions. Hepatitis A vaccine  You may need this if you have certain conditions or if you travel or work in places where you may be exposed to hepatitis A. Hepatitis B vaccine  You may need this if you have certain conditions or if you travel or work in places where you may be exposed to hepatitis B. Haemophilus influenzae type b (Hib) vaccine  You may need this if you have certain conditions. Human papillomavirus (HPV) vaccine  If recommended by your health care provider, you may need three doses over 6 months. You may receive vaccines as individual doses or as more than one vaccine together in one shot (combination vaccines). Talk with your health care provider about the risks and benefits of combination vaccines. What tests do I need? Blood tests  Lipid and cholesterol levels. These may be checked every 5 years, or more frequently if you are over 97 years old.  Hepatitis C test.  Hepatitis B test. Screening  Lung cancer screening. You may have this screening every year starting at age 83 if you have a 30-pack-year history of smoking and currently smoke or have quit within the past 15 years.  Colorectal cancer screening. All adults should have this screening starting at age 15 and continuing until age 60. Your health care provider may  recommend screening at age 62 if you are at increased risk. You will have tests every 1-10 years, depending on your results and the type of screening test.  Diabetes screening. This is done by checking your blood sugar (glucose) after you have not eaten for a while (fasting). You may have this done every 1-3 years.  Mammogram. This may be  done every 1-2 years. Talk with your health care provider about when you should start having regular mammograms. This may depend on whether you have a family history of breast cancer.  BRCA-related cancer screening. This may be done if you have a family history of breast, ovarian, tubal, or peritoneal cancers.  Pelvic exam and Pap test. This may be done every 3 years starting at age 73. Starting at age 30, this may be done every 5 years if you have a Pap test in combination with an HPV test. Other tests  Sexually transmitted disease (STD) testing.  Bone density scan. This is done to screen for osteoporosis. You may have this scan if you are at high risk for osteoporosis. Follow these instructions at home: Eating and drinking  Eat a diet that includes fresh fruits and vegetables, whole grains, lean protein, and low-fat dairy.  Take vitamin and mineral supplements as recommended by your health care provider.  Do not drink alcohol if: ? Your health care provider tells you not to drink. ? You are pregnant, may be pregnant, or are planning to become pregnant.  If you drink alcohol: ? Limit how much you have to 0-1 drink a day. ? Be aware of how much alcohol is in your drink. In the [place] one drink equals one 12 oz bottle of beer (355 mL), one 5 oz glass of wine (148 mL), or one 1 oz glass of hard liquor (44 mL). Lifestyle  Take daily care of your teeth and gums.  Stay active. Exercise for at least 30 minutes on 5 or more days each week.  Do not use any products that contain nicotine or tobacco, such as cigarettes, e-cigarettes, and chewing tobacco. If you need help quitting, ask your health care provider.  If you are sexually active, practice safe sex. Use a condom or other form of birth control (contraception) in order to prevent pregnancy and STIs (sexually transmitted infections).  If told by your health care provider, take low-dose aspirin daily starting at age 37. What's  next?  Visit your health care provider once a year for a well check visit.  Ask your health care provider how often you should have your eyes and teeth checked.  Stay up to date on all vaccines. This information is not intended to replace advice given to you by your health care provider. Make sure you discuss any questions you have with your health care provider. Document Revised: 05/22/2018 Document Reviewed: 05/22/2018 Elsevier Patient Education  2020 [employer].

## 2019-10-09 NOTE — Progress Notes (Signed)
Chief Complaint  Patient presents with  . Annual Exam    Pt has no concerns today     HPI: Patient  Lisa Bradford  49 y.o. comes in today for Preventive Health Care visit  Allergies better  After  moved to no carpet new residence [facility] floor s\ Thyroid  No problem no changes  Had gyne check had mammo and [place] for breast  Issues but normal neg evaluation  prilosec every other day .  w otc and doing well controlled  Health Maintenance  Topic Date Due  . HIV Screening  03/20/1985  . PAP SMEAR-Modifier  09/22/2021  . TETANUS/TDAP  04/01/2022  . 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 GI /GU: No adominal pain, vomiting, change in bowel habits. No blood in the stool. No significant GU symptoms. SKIN/HEME: ,no acute skin rashes suspicious lesions or bleeding. No lymphadenopathy, nodules, masses.  Hair as above  NEURO/ PSYCH:  No neurologic signs such as weakness numbness. No depression anxiety. IMM/ Allergy: No unusual infections.  Allergy .   REST of 12 system review negative except as per HPI   Past Medical History:  Diagnosis Date  . Allergy   . Heart murmur    echo in 98  . Hypothyroidism    pos antibodies  goiter     Past Surgical History:  Procedure Laterality Date  . CERVICAL POLYPECTOMY      Family History  Problem Relation Age of Onset  . Cancer Mother        ovarian and cervical  . Breast cancer Mother   . Cancer Maternal Grandmother        lung  . Cancer Maternal Grandfather        lung and pancreatic  . Diabetes Maternal  Grandfather   . Coronary artery disease Father        stent  . Diabetes Father   . Diabetes Sister   . Breast cancer Maternal Aunt   . Diabetes Paternal Grandmother       Outpatient Medications Prior to Visit  Medication Sig Dispense Refill  . levonorgestrel (MIRENA) 20 MCG/24HR IUD 1 each by Intrauterine route once.    Marland Kitchen levothyroxine (SYNTHROID, LEVOTHROID) 100 MCG tablet TAKE 1 TABLET(100 MCG) BY MOUTH DAILY 90 tablet 3  . omeprazole (PRILOSEC) 20 MG capsule Take 20 mg by mouth daily.    . fluticasone (FLONASE) 50 MCG/ACT nasal spray INSTILL 2 SPRAYS IN EACH NOSTRIL EVERY DAY AS NEEDED FOR ALLERGIES AND SINUSITIS (Patient not taking: Reported on 10/09/2019) 48 g 3   No facility-administered medications prior to visit.     EXAM:  BP 118/62 (BP Location: Right Arm, Patient Position: Sitting, Cuff Size: Normal)   Pulse 67   Temp 97.6 F (36.4 C) (Temporal)   Ht _0  (1.626 m)   Wt 143 lb 6.4 oz (65 kg)   SpO2 98%   BMI 24.61 kg/m   Body mass index is 24.61 kg/m. Wt Readings from Last 3 Encounters:  10/09/19 143 lb 6.4  oz (65 kg)  05/05/19 151 lb (68.5 kg)  11/03/18 164 lb 11.2 oz (74.7 kg)    Physical Exam: Vital signs reviewed BLT:JQZE is a well-developed well-nourished alert cooperative    who appearsr stated age in no acute distress.  HEENT: normocephalic atraumatic , Eyes: PERRL EOM's full, conjunctiva clear, , Ears: no deformity EAC's clear TMs with normal landmarks. Mouth masked NECK: supple without masses, thyromegaly or bruits. CHEST/PULM:  Clear to auscultation and percussion breath sounds equal no wheeze , rales or rhonchi. No chest wall deformities or tenderness. Breast: normal by inspection . No dimpling, discharge, masses, tenderness or discharge . CV: PMI is nondisplaced, S1 S2 no gallops, murmurs, rubs. Peripheral pulses are full without delay.No JVD .  ABDOMEN: Bowel sounds normal nontender  No guard or rebound, no hepato splenomegal no CVA tenderness.   No hernia. Extremtities:  No clubbing cyanosis or edema, no acute joint swelling or redness no focal atrophy NEURO:  Oriented x3, cranial nerves 3-12 appear to be intact, no obvious focal weakness,gait within normal limits no abnormal reflexes or asymmetrical SKIN: No acute rashes normal turgor, color, no bruising or petechiae. Hair  Mild  Thinning top of head no recession  And no scalp lesion PSYCH: Oriented, good eye contact, no obvious depression anxiety, cognition and judgment appear normal. LN: no cervical axillary inguinal adenopathy  Lab Results  Component Value Date   WBC 6.9 08/19/2017   HGB 13.1 08/19/2017   HCT 39.2 08/19/2017   PLT 187.0 08/19/2017   GLUCOSE 77 11/03/2018   CHOL 179 11/03/2018   TRIG 106.0 11/03/2018   HDL 41.40 11/03/2018   LDLDIRECT 96.0 08/08/2016   LDLCALC 116 (H) 11/03/2018   ALT 12 08/19/2017   AST 13 08/19/2017   NA 140 11/03/2018   K 4.8 11/03/2018   CL 103 11/03/2018   CREATININE 0.92 11/03/2018   BUN 22 11/03/2018   CO2 29 11/03/2018   TSH 0.90 11/03/2018    BP Readings from Last 3 Encounters:  10/09/19 118/62  05/05/19 112/74  11/03/18 118/62    Lab plan  reviewed with patient  Had protein shake this am with banana   ASSESSMENT AND PLAN:  Discussed the following assessment and plan:    ICD-10-CM   1. Visit for preventive health examination  S92.33 Basic metabolic panel    CBC with Differential    Hepatic function panel    Lipid panel    TSH    T4, Free (Thyrox)    Follicle Stimulating Hormone  2. Hypothyroidism, unspecified type  A07.6 Basic metabolic panel    CBC with Differential    Hepatic function panel    Lipid panel    TSH    T4, Free (Thyrox)    Follicle Stimulating Hormone  3. Hyperlipidemia, unspecified hyperlipidemia type  A26.3 Basic metabolic panel    CBC with Differential    Hepatic function panel    Lipid panel    TSH    T4, Free (Thyrox)    Follicle Stimulating Hormone  4. Medication management   F35.456 Basic metabolic panel    CBC with Differential    Hepatic function panel    Lipid panel    TSH    T4, Free (Thyrox)    Follicle Stimulating Hormone  5. Need for immunization against influenza  Z23 Flu Vaccine QUAD 36+ mos IM  6. Perimenopausal  Y56.3 Basic metabolic panel    CBC with Differential    Hepatic function panel  Lipid panel    TSH    T4, Free (Thyrox)    Follicle Stimulating Hormone   Hair changes prob age genetics thyroid and poss perimenopause  Hair colored also  Lab today  Colon cancer screening disc Patient Care Team: Levone Otten, Standley Brooking, MD as PCP - General Everett Graff, MD (Obstetrics and Gynecology) Luberta Mutter, MD (Ophthalmology) dermatologist Patient Instructions  Glad you are doing well  Hair changes could be from thyroid age genetics and perimenopause   Continue lifestyle intervention healthy eating and exercise . Will notify you  of labs when available.  Advise covid vaccine for all patients not allergic Check on  cologuard for colon cancer screening.   Preventive Care 27-34 Years Old, Female Preventive care refers to visits with your health care provider and lifestyle choices that can promote health and wellness. This includes:  A yearly physical exam. This may also be called an annual well check.  Regular dental visits and eye exams.  Immunizations.  Screening for certain conditions.  Healthy lifestyle choices, such as eating a healthy diet, getting regular exercise, not using drugs or products that contain nicotine and tobacco, and limiting alcohol use. What can I expect for my preventive care visit? Physical exam Your health care provider will check your:  Height and weight. This may be used to calculate body mass index (BMI), which tells if you are at a healthy weight.  Heart rate and blood pressure.  Skin for abnormal spots. Counseling Your health care provider may ask you questions about your:  Alcohol, tobacco, and  drug use.  Emotional well-being.  Home and relationship well-being.  Sexual activity.  Eating habits.  Work and work Statistician.  Method of birth control.  Menstrual cycle.  Pregnancy history. What immunizations do I need?  Influenza (flu) vaccine  This is recommended every year. Tetanus, diphtheria, and pertussis (Tdap) vaccine  You may need a Td booster every 10 years. Varicella (chickenpox) vaccine  You may need this if you have not been vaccinated. Zoster (shingles) vaccine  You may need this after age 41. Measles, mumps, and rubella (MMR) vaccine  You may need at least one dose of MMR if you were born in 1957 or later. You may also need a second dose. Pneumococcal conjugate (PCV13) vaccine  You may need this if you have certain conditions and were not previously vaccinated. Pneumococcal polysaccharide (PPSV23) vaccine  You may need one or two doses if you smoke cigarettes or if you have certain conditions. Meningococcal conjugate (MenACWY) vaccine  You may need this if you have certain conditions. Hepatitis A vaccine  You may need this if you have certain conditions or if you travel or work in places where you may be exposed to hepatitis A. Hepatitis B vaccine  You may need this if you have certain conditions or if you travel or work in places where you may be exposed to hepatitis B. Haemophilus influenzae type b (Hib) vaccine  You may need this if you have certain conditions. Human papillomavirus (HPV) vaccine  If recommended by your health care provider, you may need three doses over 6 months. You may receive vaccines as individual doses or as more than one vaccine together in one shot (combination vaccines). Talk with your health care provider about the risks and benefits of combination vaccines. What tests do I need? Blood tests  Lipid and cholesterol levels. These may be checked every 5 years, or more frequently if you are over 40 years  old.  Hepatitis C test.  Hepatitis B test. Screening  Lung cancer screening. You may have this screening every year starting at age 82 if you have a 30-pack-year history of smoking and currently smoke or have quit within the past 15 years.  Colorectal cancer screening. All adults should have this screening starting at age 23 and continuing until age 31. Your health care provider may recommend screening at age 70 if you are at increased risk. You will have tests every 1-10 years, depending on your results and the type of screening test.  Diabetes screening. This is done by checking your blood sugar (glucose) after you have not eaten for a while (fasting). You may have this done every 1-3 years.  Mammogram. This may be done every 1-2 years. Talk with your health care provider about when you should start having regular mammograms. This may depend on whether you have a family history of breast cancer.  BRCA-related cancer screening. This may be done if you have a family history of breast, ovarian, tubal, or peritoneal cancers.  Pelvic exam and Pap test. This may be done every 3 years starting at age 60. Starting at age 2, this may be done every 5 years if you have a Pap test in combination with an HPV test. Other tests  Sexually transmitted disease (STD) testing.  Bone density scan. This is done to screen for osteoporosis. You may have this scan if you are at high risk for osteoporosis. Follow these instructions at home: Eating and drinking  Eat a diet that includes fresh fruits and vegetables, whole grains, lean protein, and low-fat dairy.  Take vitamin and mineral supplements as recommended by your health care provider.  Do not drink alcohol if: ? Your health care provider tells you not to drink. ? You are pregnant, may be pregnant, or are planning to become pregnant.  If you drink alcohol: ? Limit how much you have to 0-1 drink a day. ? Be aware of how much alcohol is in your  drink. In the [place] one drink equals one 12 oz bottle of beer (355 mL), one 5 oz glass of wine (148 mL), or one 1 oz glass of hard liquor (44 mL). Lifestyle  Take daily care of your teeth and gums.  Stay active. Exercise for at least 30 minutes on 5 or more days each week.  Do not use any products that contain nicotine or tobacco, such as cigarettes, e-cigarettes, and chewing tobacco. If you need help quitting, ask your health care provider.  If you are sexually active, practice safe sex. Use a condom or other form of birth control (contraception) in order to prevent pregnancy and STIs (sexually transmitted infections).  If told by your health care provider, take low-dose aspirin daily starting at age 50. What's next?  Visit your health care provider once a year for a well check visit.  Ask your health care provider how often you should have your eyes and teeth checked.  Stay up to date on all vaccines. This information is not intended to replace advice given to you by your health care provider. Make sure you discuss any questions you have with your health care provider. Document Revised: 05/22/2018 Document Reviewed: 05/22/2018 Elsevier Patient Education  2020 [place] Mirinda Monte M.D.

## 2019-11-18 LAB — HM PAP SMEAR

## 2019-11-19 ENCOUNTER — Encounter: Payer: Self-pay | Admitting: Internal Medicine

## 2019-12-18 ENCOUNTER — Ambulatory Visit: Payer: PRIVATE HEALTH INSURANCE | Attending: Internal Medicine

## 2019-12-18 DIAGNOSIS — Z23 Encounter for immunization: Secondary | ICD-10-CM

## 2019-12-18 NOTE — Progress Notes (Signed)
   Covid-19 Vaccination Clinic  Name:  Lisa Bradford    MRN: [id number] DOB: Feb 17, 1971  12/18/2019  [name] was observed post Covid-19 immunization for 15 minutes without incident. She was provided with Vaccine Information Sheet and instruction to access the V-Safe system.   [name] was instructed to call 911 with any severe reactions post vaccine: Marland Kitchen Difficulty breathing  . Swelling of face and throat  . A fast heartbeat  . A bad rash all over body  . Dizziness and weakness   Immunizations Administered    Name Date Dose VIS Date Route   Pfizer COVID-19 Vaccine 12/18/2019  3:37 PM 0.3 mL 09/04/2019 Intramuscular   Manufacturer: [employer], [employer]   Lot: [id number]   NDC: [address]-6

## 2020-01-12 ENCOUNTER — Ambulatory Visit: Payer: PRIVATE HEALTH INSURANCE | Attending: Internal Medicine

## 2020-01-12 DIAGNOSIS — Z23 Encounter for immunization: Secondary | ICD-10-CM

## 2020-01-12 NOTE — Progress Notes (Signed)
   Covid-19 Vaccination Clinic  Name:  Lisa Bradford    MRN: [id number] DOB: September 08, 1971  01/12/2020  Lisa Bradford was observed post Covid-19 immunization for 15 minutes without incident. She was provided with Vaccine Information Sheet and instruction to access the V-Safe system.   Lisa Bradford was instructed to call 911 with any severe reactions post vaccine: Marland Kitchen Difficulty breathing  . Swelling of face and throat  . A fast heartbeat  . A bad rash all over body  . Dizziness and weakness   Immunizations Administered    Name Date Dose VIS Date Route   Pfizer COVID-19 Vaccine 01/12/2020  4:39 PM 0.3 mL 11/18/2018 Intramuscular   Manufacturer: [employer], [employer]   Lot: [id number]   NDC: [address]-7

## 2020-02-03 ENCOUNTER — Other Ambulatory Visit: Payer: Self-pay | Admitting: Internal Medicine

## 2020-07-11 ENCOUNTER — Other Ambulatory Visit: Payer: Self-pay | Admitting: Internal Medicine

## 2020-11-29 ENCOUNTER — Other Ambulatory Visit: Payer: Self-pay

## 2020-11-29 ENCOUNTER — Ambulatory Visit (INDEPENDENT_AMBULATORY_CARE_PROVIDER_SITE_OTHER): Payer: PRIVATE HEALTH INSURANCE | Admitting: Internal Medicine

## 2020-11-29 ENCOUNTER — Encounter: Payer: Self-pay | Admitting: Internal Medicine

## 2020-11-29 VITALS — BP 100/66 | HR 68 | Temp 98.2°F | Ht 65.16 in | Wt 148.8 lb

## 2020-11-29 DIAGNOSIS — M545 Low back pain, unspecified: Secondary | ICD-10-CM

## 2020-11-29 DIAGNOSIS — E785 Hyperlipidemia, unspecified: Secondary | ICD-10-CM

## 2020-11-29 DIAGNOSIS — E039 Hypothyroidism, unspecified: Secondary | ICD-10-CM | POA: Diagnosis not present

## 2020-11-29 DIAGNOSIS — Z0000 Encounter for general adult medical examination without abnormal findings: Secondary | ICD-10-CM

## 2020-11-29 NOTE — Patient Instructions (Signed)
consider seeing sports medicine about the back   Can try  advil aleve for 5-7 days  As needed.   Will notify you  of labs when available.   cologuard    Health Maintenance, Female Adopting a healthy lifestyle and getting preventive care are important in promoting health and wellness. Ask your health care provider about:  The right schedule for you to have regular tests and exams.  Things you can do on your own to prevent diseases and keep yourself healthy. What should I know about diet, weight, and exercise? Eat a healthy diet  Eat a diet that includes plenty of vegetables, fruits, low-fat dairy products, and lean protein.  Do not eat a lot of foods that are high in solid fats, added sugars, or sodium.   Maintain a healthy weight Body mass index (BMI) is used to identify weight problems. It estimates body fat based on height and weight. Your health care provider can help determine your BMI and help you achieve or maintain a healthy weight. Get regular exercise Get regular exercise. This is one of the most important things you can do for your health. Most adults should:  Exercise for at least 150 minutes each week. The exercise should increase your heart rate and make you sweat (moderate-intensity exercise).  Do strengthening exercises at least twice a week. This is in addition to the moderate-intensity exercise.  Spend less time sitting. Even light physical activity can be beneficial. Watch cholesterol and blood lipids Have your blood tested for lipids and cholesterol at 51 years of age, then have this test every 5 years. Have your cholesterol levels checked more often if:  Your lipid or cholesterol levels are high.  You are older than 51 years of age.  You are at high risk for heart disease. What should I know about cancer screening? Depending on your health history and family history, you may need to have cancer screening at various ages. This may include screening  for:  Breast cancer.  Cervical cancer.  Colorectal cancer.  Skin cancer.  Lung cancer. What should I know about heart disease, diabetes, and high blood pressure? Blood pressure and heart disease  High blood pressure causes heart disease and increases the risk of stroke. This is more likely to develop in people who have high blood pressure readings, are of African descent, or are overweight.  Have your blood pressure checked: ? Every 3-5 years if you are 86-92 years of age. ? Every year if you are 44 years old or older. Diabetes Have regular diabetes screenings. This checks your fasting blood sugar level. Have the screening done:  Once every three years after age 51 if you are at a normal weight and have a low risk for diabetes.  More often and at a younger age if you are overweight or have a high risk for diabetes. What should I know about preventing infection? Hepatitis B If you have a higher risk for hepatitis B, you should be screened for this virus. Talk with your health care provider to find out if you are at risk for hepatitis B infection. Hepatitis C Testing is recommended for:  Everyone born from [birth year] through 1966.  Anyone with known risk factors for hepatitis C. Sexually transmitted infections (STIs)  Get screened for STIs, including gonorrhea and chlamydia, if: ? You are sexually active and are younger than 51 years of age. ? You are older than 51 years of age and your health care provider tells you  that you are at risk for this type of infection. ? Your sexual activity has changed since you were last screened, and you are at increased risk for chlamydia or gonorrhea. Ask your health care provider if you are at risk.  Ask your health care provider about whether you are at high risk for HIV. Your health care provider may recommend a prescription medicine to help prevent HIV infection. If you choose to take medicine to prevent HIV, you should first get tested for HIV.  You should then be tested every 3 months for as long as you are taking the medicine. Pregnancy  If you are about to stop having your period (premenopausal) and you may become pregnant, seek counseling before you get pregnant.  Take 400 to 800 micrograms (mcg) of folic acid every day if you become pregnant.  Ask for birth control (contraception) if you want to prevent pregnancy. Osteoporosis and menopause Osteoporosis is a disease in which the bones lose minerals and strength with aging. This can result in bone fractures. If you are 23 years old or older, or if you are at risk for osteoporosis and fractures, ask your health care provider if you should:  Be screened for bone loss.  Take a calcium or vitamin D supplement to lower your risk of fractures.  Be given hormone replacement therapy (HRT) to treat symptoms of menopause. Follow these instructions at home: Lifestyle  Do not use any products that contain nicotine or tobacco, such as cigarettes, e-cigarettes, and chewing tobacco. If you need help quitting, ask your health care provider.  Do not use street drugs.  Do not share needles.  Ask your health care provider for help if you need support or information about quitting drugs. Alcohol use  Do not drink alcohol if: ? Your health care provider tells you not to drink. ? You are pregnant, may be pregnant, or are planning to become pregnant.  If you drink alcohol: ? Limit how much you use to 0-1 drink a day. ? Limit intake if you are breastfeeding.  Be aware of how much alcohol is in your drink. In the [place] one drink equals one 12 oz bottle of beer (355 mL), one 5 oz glass of wine (148 mL), or one 1 oz glass of hard liquor (44 mL). General instructions  Schedule regular health, dental, and eye exams.  Stay current with your vaccines.  Tell your health care provider if: ? You often feel depressed. ? You have ever been abused or do not feel safe at  home. Summary  Adopting a healthy lifestyle and getting preventive care are important in promoting health and wellness.  Follow your health care provider's instructions about healthy diet, exercising, and getting tested or screened for diseases.  Follow your health care provider's instructions on monitoring your cholesterol and blood pressure. This information is not intended to replace advice given to you by your health care provider. Make sure you discuss any questions you have with your health care provider. Document Revised: 09/03/2018 Document Reviewed: 09/03/2018 Elsevier Patient Education  2021 [employer].

## 2020-11-29 NOTE — Progress Notes (Signed)
Chief Complaint  Patient presents with  . Annual Exam    HPI: Patient  Lisa Bradford  50 y.o. comes in today for Preventive Health Care visit  She takes thyroid medicine every day at least half hour to 45 minutes for 4 other pills or food.   Generally well but has had about 2 years of pain right lower back that is fairly persistent nonradiating and no associated symptoms does regular exercise no specific injury.  No specific treatment that has been helpful.  Health Maintenance  Topic Date Due  . Hepatitis C Screening  Never done  . HIV Screening  Never done  . COLONOSCOPY (Pts 45-52yrs Insurance coverage will need to be confirmed)  Never done  . MAMMOGRAM  03/20/2020  . TETANUS/TDAP  04/01/2022  . PAP SMEAR-Modifier  11/17/2022  . INFLUENZA VACCINE  Completed  . COVID-19 Vaccine  Completed  . HPV VACCINES  Aged Out   Health Maintenance Review LIFESTYLE:  Exercise:   5 days per week.  Program   Tobacco/ETS: n Alcohol: ocass  Sugar beverages: ocass sweet tea. 1-2 per week.  Sleep: 10 - 6  Drug use: no   HH of  1 1 dog  Work: 40   echinachia or eldreberry FO  Ca  obgyne  No periods  Needs colon cancer screening okay to order Cologuard ROS:  GEN/ HEENT: No fever, significant weight changes sweats headaches vision problems hearing changes, CV/ PULM; No chest pain shortness of breath cough, syncope,edema  change in exercise tolerance. GI /GU: No adominal pain, vomiting, change in bowel habits. No blood in the stool. No significant GU symptoms. SKIN/HEME: ,no acute skin rashes suspicious lesions or bleeding. No lymphadenopathy, nodules, masses.  NEURO/ PSYCH:  No neurologic signs such as weakness numbness. No depression anxiety. IMM/ Allergy: No unusual infections.  Allergy .   REST of 12 system review negative except as per HPI   Past Medical History:  Diagnosis Date  . Allergy   . GERD (gastroesophageal reflux disease) 12/26/2011  . Heart murmur    echo in 98  .  Hypothyroidism    pos antibodies  goiter   . THROMBOCYTOPENIA 02/01/2009   Qualifier: Diagnosis of  By: Fabian Sharp MD, Neta Mends     Past Surgical History:  Procedure Laterality Date  . CERVICAL POLYPECTOMY      Family History  Problem Relation Age of Onset  . Cancer Mother        ovarian and cervical  . Breast cancer Mother   . Cancer Maternal Grandmother        lung  . Cancer Maternal Grandfather        lung and pancreatic  . Diabetes Maternal Grandfather   . Coronary artery disease Father        stent  . Diabetes Father   . Diabetes Sister   . Breast cancer Maternal Aunt   . Diabetes Paternal Grandmother     Social History   Socioeconomic History  . Marital status: Single    Spouse name: Not on file  . Number of children: 0  . Years of education: Not on file  . Highest education level: Bachelor's degree (e.g., BA, AB, BS)  Occupational History  . Not on file  Tobacco Use  . Smoking status: Never Smoker  . Smokeless tobacco: Never Used  Vaping Use  . Vaping Use: Never used  Substance and Sexual Activity  . Alcohol use: Yes    Alcohol/week: 0.0 standard  drinks    Comment: once every 2 weeks  . Drug use: No  . Sexual activity: Not Currently    Birth control/protection: I.U.D.  Other Topics Concern  . Not on file  Social History Narrative   HH of 1   Pet cat.   ocass exercise .   No tob    rare etoh   caffiene  2 per day   Work  40 hours per week.    Good sleep.    Social Determinants of Health   Financial Resource Strain: Not on file  Food Insecurity: Not on file  Transportation Needs: Not on file  Physical Activity: Not on file  Stress: Not on file  Social Connections: Not on file    Outpatient Medications Prior to Visit  Medication Sig Dispense Refill  . levonorgestrel (MIRENA) 20 MCG/24HR IUD 1 each by Intrauterine route once.    Marland Kitchen levothyroxine (SYNTHROID) 100 MCG tablet Take 1 tablet by mouth daily. 90 tablet 1  . omeprazole (PRILOSEC) 20 MG  capsule Take 20 mg by mouth daily.    . fluticasone (FLONASE) 50 MCG/ACT nasal spray INSTILL 2 SPRAYS IN EACH NOSTRIL EVERY DAY AS NEEDED FOR ALLERGIES AND SINUSITIS (Patient not taking: No sig reported) 48 g 3   No facility-administered medications prior to visit.     EXAM:  BP 100/66 (BP Location: Left Arm, Patient Position: Sitting, Cuff Size: Normal)   Pulse 68   Temp 98.2 F (36.8 C) (Oral)   Ht 5' 5.16" (1.655 m)   Wt 148 lb 12.8 oz (67.5 kg)   SpO2 98%   BMI 24.64 kg/m   Body mass index is 24.64 kg/m. Wt Readings from Last 3 Encounters:  11/29/20 148 lb 12.8 oz (67.5 kg)  10/09/19 143 lb 6.4 oz (65 kg)  05/05/19 151 lb (68.5 kg)    Physical Exam: Vital signs reviewed XBD:ZHGD is a well-developed well-nourished alert cooperative    who appearsr stated age in no acute distress.  HEENT: normocephalic atraumatic , Eyes: PERRL EOM's full, conjunctiva clear, Nares: paten,t no deformity discharge or tenderness., Ears: no deformity EAC's clear TMs with normal landmarks. Mouthmasked NECK: supple without masses, thyromegaly or bruits. CHEST/PULM:  Clear to auscultation and percussion breath sounds equal no wheeze , rales or rhonchi. No chest wall deformities or tenderness. Breast: normal by inspection . No dimpling, discharge, masses, tenderness or discharge . CV: PMI is nondisplaced, S1 S2 no gallops, murmurs, rubs. Peripheral pulses are full without delay.No JVD .  ABDOMEN: Bowel sounds normal nontender  No guard or rebound, no hepato splenomegal no CVA tenderness.   Extremtities:  No clubbing cyanosis or edema, no acute joint swelling or redness no focal atrophy Back no midline tenderness points to area right SI joint area but normal gait no obvious lower extremity difficulties NEURO:  Oriented x3, cranial nerves 3-12 appear to be intact, no obvious focal weakness,gait within normal limits no abnormal reflexes or asymmetrical SKIN: No acute rashes normal turgor, color, no  bruising or petechiae. PSYCH: Oriented, good eye contact, no obvious depression anxiety, cognition and judgment appear normal. LN: no cervical axillary adenopathy  Lab Results  Component Value Date   WBC 7.1 10/09/2019   HGB 13.3 10/09/2019   HCT 39.7 10/09/2019   PLT 181.0 10/09/2019   GLUCOSE 57 (L) 10/09/2019   CHOL 172 10/09/2019   TRIG 84.0 10/09/2019   HDL 50.40 10/09/2019   LDLDIRECT 96.0 08/08/2016   LDLCALC 105 (H) 10/09/2019   ALT  18 10/09/2019   AST 18 10/09/2019   NA 139 10/09/2019   K 4.4 10/09/2019   CL 101 10/09/2019   CREATININE 1.01 10/09/2019   BUN 37 (H) 10/09/2019   CO2 29 10/09/2019   TSH 3.44 10/09/2019    BP Readings from Last 3 Encounters:  11/29/20 100/66  10/09/19 118/62  05/05/19 112/74    Lab plan reviewed with patient   Sweet tea and candy bar .  About 2 hours ago.  ASSESSMENT AND PLAN:  Discussed the following assessment and plan:    ICD-10-CM   1. Visit for preventive health examination  Z00.00 Basic metabolic panel    CBC with Differential/Platelet    Hepatic function panel    Lipid panel    TSH    Sedimentation rate    C-reactive protein    C-reactive protein    Sedimentation rate    TSH    Lipid panel    Hepatic function panel    CBC with Differential/Platelet    Basic metabolic panel  2. Hypothyroidism, unspecified type  E03.9 Basic metabolic panel    CBC with Differential/Platelet    Hepatic function panel    Lipid panel    TSH    Sedimentation rate    C-reactive protein    C-reactive protein    Sedimentation rate    TSH    Lipid panel    Hepatic function panel    CBC with Differential/Platelet    Basic metabolic panel  3. Hyperlipidemia, unspecified hyperlipidemia type  E78.5 Basic metabolic panel    CBC with Differential/Platelet    Hepatic function panel    Lipid panel    TSH    Sedimentation rate    C-reactive protein    C-reactive protein    Sedimentation rate    TSH    Lipid panel    Hepatic  function panel    CBC with Differential/Platelet    Basic metabolic panel  4. Right-sided low back pain without sciatica, unspecified chronicity  M54.50 Basic metabolic panel    CBC with Differential/Platelet    Hepatic function panel    Lipid panel    TSH    Sedimentation rate    C-reactive protein    C-reactive protein    Sedimentation rate    TSH    Lipid panel    Hepatic function panel    CBC with Differential/Platelet    Basic metabolic panel  Nonfasting labs today optional Add inflammatory markers Back pain sounds mechanical  Si area  with no systemic symptoms however is been prolonged consider seeing sports medicine she is physically active doing different exercise programs but still continue.  Thyroid TSH today. Order cologuard  Return for depending on results 1 year .  Patient Care Team: Yu Cragun, Neta Mends, MD as PCP - General Osborn Coho, MD (Obstetrics and Gynecology) Maris Berger, MD (Ophthalmology) dermatologist Patient Instructions   consider seeing sports medicine about the back   Can try  advil aleve for 5-7 days  As needed.   Will notify you  of labs when available.   cologuard    Health Maintenance, Female Adopting a healthy lifestyle and getting preventive care are important in promoting health and wellness. Ask your health care provider about:  The right schedule for you to have regular tests and exams.  Things you can do on your own to prevent diseases and keep yourself healthy. What should I know about diet, weight, and exercise? Eat a healthy diet  Eat a diet that includes plenty of vegetables, fruits, low-fat dairy products, and lean protein.  Do not eat a lot of foods that are high in solid fats, added sugars, or sodium.   Maintain a healthy weight Body mass index (BMI) is used to identify weight problems. It estimates body fat based on height and weight. Your health care provider can help determine your BMI and help you achieve or  maintain a healthy weight. Get regular exercise Get regular exercise. This is one of the most important things you can do for your health. Most adults should:  Exercise for at least 150 minutes each week. The exercise should increase your heart rate and make you sweat (moderate-intensity exercise).  Do strengthening exercises at least twice a week. This is in addition to the moderate-intensity exercise.  Spend less time sitting. Even light physical activity can be beneficial. Watch cholesterol and blood lipids Have your blood tested for lipids and cholesterol at 50 years of age, then have this test every 5 years. Have your cholesterol levels checked more often if:  Your lipid or cholesterol levels are high.  You are older than 51 years of age.  You are at high risk for heart disease. What should I know about cancer screening? Depending on your health history and family history, you may need to have cancer screening at various ages. This may include screening for:  Breast cancer.  Cervical cancer.  Colorectal cancer.  Skin cancer.  Lung cancer. What should I know about heart disease, diabetes, and high blood pressure? Blood pressure and heart disease  High blood pressure causes heart disease and increases the risk of stroke. This is more likely to develop in people who have high blood pressure readings, are of African descent, or are overweight.  Have your blood pressure checked: ? Every 3-5 years if you are 88-18 years of age. ? Every year if you are 79 years old or older. Diabetes Have regular diabetes screenings. This checks your fasting blood sugar level. Have the screening done:  Once every three years after age 20 if you are at a normal weight and have a low risk for diabetes.  More often and at a younger age if you are overweight or have a high risk for diabetes. What should I know about preventing infection? Hepatitis B If you have a higher risk for hepatitis B,  you should be screened for this virus. Talk with your health care provider to find out if you are at risk for hepatitis B infection. Hepatitis C Testing is recommended for:  Everyone born from [birth year] through 1966.  Anyone with known risk factors for hepatitis C. Sexually transmitted infections (STIs)  Get screened for STIs, including gonorrhea and chlamydia, if: ? You are sexually active and are younger than 51 years of age. ? You are older than 51 years of age and your health care provider tells you that you are at risk for this type of infection. ? Your sexual activity has changed since you were last screened, and you are at increased risk for chlamydia or gonorrhea. Ask your health care provider if you are at risk.  Ask your health care provider about whether you are at high risk for HIV. Your health care provider may recommend a prescription medicine to help prevent HIV infection. If you choose to take medicine to prevent HIV, you should first get tested for HIV. You should then be tested every 3 months for as long as you are  taking the medicine. Pregnancy  If you are about to stop having your period (premenopausal) and you may become pregnant, seek counseling before you get pregnant.  Take 400 to 800 micrograms (mcg) of folic acid every day if you become pregnant.  Ask for birth control (contraception) if you want to prevent pregnancy. Osteoporosis and menopause Osteoporosis is a disease in which the bones lose minerals and strength with aging. This can result in bone fractures. If you are 51 years old or older, or if you are at risk for osteoporosis and fractures, ask your health care provider if you should:  Be screened for bone loss.  Take a calcium or vitamin D supplement to lower your risk of fractures.  Be given hormone replacement therapy (HRT) to treat symptoms of menopause. Follow these instructions at home: Lifestyle  Do not use any products that contain nicotine or  tobacco, such as cigarettes, e-cigarettes, and chewing tobacco. If you need help quitting, ask your health care provider.  Do not use street drugs.  Do not share needles.  Ask your health care provider for help if you need support or information about quitting drugs. Alcohol use  Do not drink alcohol if: ? Your health care provider tells you not to drink. ? You are pregnant, may be pregnant, or are planning to become pregnant.  If you drink alcohol: ? Limit how much you use to 0-1 drink a day. ? Limit intake if you are breastfeeding.  Be aware of how much alcohol is in your drink. In the [place] one drink equals one 12 oz bottle of beer (355 mL), one 5 oz glass of wine (148 mL), or one 1 oz glass of hard liquor (44 mL). General instructions  Schedule regular health, dental, and eye exams.  Stay current with your vaccines.  Tell your health care provider if: ? You often feel depressed. ? You have ever been abused or do not feel safe at home. Summary  Adopting a healthy lifestyle and getting preventive care are important in promoting health and wellness.  Follow your health care provider's instructions about healthy diet, exercising, and getting tested or screened for diseases.  Follow your health care provider's instructions on monitoring your cholesterol and blood pressure. This information is not intended to replace advice given to you by your health care provider. Make sure you discuss any questions you have with your health care provider. Document Revised: 09/03/2018 Document Reviewed: 09/03/2018 Elsevier Patient Education  2021 [employer]     Lisa Bradford M.D.

## 2020-11-30 LAB — CBC WITH DIFFERENTIAL/PLATELET
Basophils Absolute: 0 10*3/uL (ref 0.0–0.1)
Basophils Relative: 0.5 % (ref 0.0–3.0)
Eosinophils Absolute: 0.1 10*3/uL (ref 0.0–0.7)
Eosinophils Relative: 2.1 % (ref 0.0–5.0)
HCT: 36.6 % (ref 36.0–46.0)
Hemoglobin: 12.5 g/dL (ref 12.0–15.0)
Lymphocytes Relative: 40.9 % (ref 12.0–46.0)
Lymphs Abs: 2.5 10*3/uL (ref 0.7–4.0)
MCHC: 34.1 g/dL (ref 30.0–36.0)
MCV: 90.1 fl (ref 78.0–100.0)
Monocytes Absolute: 0.3 10*3/uL (ref 0.1–1.0)
Monocytes Relative: 5.5 % (ref 3.0–12.0)
Neutro Abs: 3 10*3/uL (ref 1.4–7.7)
Neutrophils Relative %: 51 % (ref 43.0–77.0)
Platelets: 149 10*3/uL — ABNORMAL LOW (ref 150.0–400.0)
RBC: 4.06 Mil/uL (ref 3.87–5.11)
RDW: 12.6 % (ref 11.5–15.5)
WBC: 6 10*3/uL (ref 4.0–10.5)

## 2020-11-30 LAB — LIPID PANEL
Cholesterol: 153 mg/dL (ref 0–200)
HDL: 48 mg/dL (ref >39.00)
LDL Cholesterol: 85 mg/dL (ref 0–99)
NonHDL: 104.8
Total CHOL/HDL Ratio: 3
Triglycerides: 99 mg/dL (ref 0.0–149.0)
VLDL: 19.8 mg/dL (ref 0.0–40.0)

## 2020-11-30 LAB — BASIC METABOLIC PANEL
BUN: 22 mg/dL (ref 6–23)
CO2: 29 mEq/L (ref 19–32)
Calcium: 9.4 mg/dL (ref 8.4–10.5)
Chloride: 103 mEq/L (ref 96–112)
Creatinine, Ser: 1.18 mg/dL (ref 0.40–1.20)
GFR: 53.78 mL/min — ABNORMAL LOW (ref >60.00)
Glucose, Bld: 81 mg/dL (ref 70–99)
Potassium: 3.9 mEq/L (ref 3.5–5.1)
Sodium: 139 mEq/L (ref 135–145)

## 2020-11-30 LAB — C-REACTIVE PROTEIN: CRP: <1 mg/dL (ref 0.5–20.0)

## 2020-11-30 LAB — HEPATIC FUNCTION PANEL
ALT: 18 U/L (ref 0–35)
AST: 16 U/L (ref 0–37)
Albumin: 4.2 g/dL (ref 3.5–5.2)
Alkaline Phosphatase: 49 U/L (ref 39–117)
Bilirubin, Direct: 0.1 mg/dL (ref 0.0–0.3)
Total Bilirubin: 0.4 mg/dL (ref 0.2–1.2)
Total Protein: 7.1 g/dL (ref 6.0–8.3)

## 2020-11-30 LAB — SEDIMENTATION RATE: Sed Rate: 10 mm/hr (ref 0–30)

## 2020-11-30 LAB — TSH: TSH: 2.34 u[IU]/mL (ref 0.35–4.50)

## 2020-11-30 NOTE — Progress Notes (Signed)
Thyroid is in range rest of labs normal or barely out of range and insignificant.  Inflammatory markers are normal Kidney function stable.    Advise follow laboratory studies yearly.

## 2020-12-11 NOTE — Progress Notes (Signed)
If she agrees  refer to [facility]  for the back pain

## 2021-02-11 ENCOUNTER — Other Ambulatory Visit: Payer: Self-pay | Admitting: Internal Medicine

## 2021-03-14 LAB — COLOGUARD: Cologuard: NEGATIVE

## 2021-03-21 LAB — COLOGUARD: COLOGUARD: NEGATIVE

## 2021-04-27 ENCOUNTER — Ambulatory Visit (HOSPITAL_COMMUNITY)
Admission: EM | Admit: 2021-04-27 | Discharge: 2021-04-27 | Disposition: A | Discharge status: Home / Self Care | Payer: No Typology Code available for payment source | Attending: Family Medicine | Admitting: Family Medicine

## 2021-04-27 ENCOUNTER — Encounter (HOSPITAL_COMMUNITY): Payer: Self-pay

## 2021-04-27 ENCOUNTER — Ambulatory Visit (INDEPENDENT_AMBULATORY_CARE_PROVIDER_SITE_OTHER): Payer: No Typology Code available for payment source

## 2021-04-27 ENCOUNTER — Other Ambulatory Visit: Payer: Self-pay

## 2021-04-27 DIAGNOSIS — M79671 Pain in right foot: Secondary | ICD-10-CM

## 2021-04-27 DIAGNOSIS — S9031XA Contusion of right foot, initial encounter: Secondary | ICD-10-CM

## 2021-04-27 NOTE — ED Triage Notes (Signed)
Pt in with right foot injury that occurred after 20 lb metal box fell on foot  States she noticed some swelling and she is having some numbness and tingling

## 2021-04-27 NOTE — Discharge Instructions (Signed)
If not allergic, you may use over the counter ibuprofen or acetaminophen as needed. ° °

## 2021-05-02 NOTE — ED Provider Notes (Signed)
[facility]   [id number] 04/27/21 Arrival Time: 1823  ASSESSMENT & PLAN:  1. Contusion of right foot, initial encounter    I have personally viewed the imaging studies ordered this visit. See radiology report for possible navicular fracture; subtle. Discussed.  Orders Placed This Encounter  Procedures   DG Foot Complete Right   Apply CAM boot  Has crutches at home. To remain non-weight bearing until f/u.  Prefers OTC analgesics.  Recommend:  Follow-up Information     [facility]). Schedule an appointment as soon as possible for a visit .   Contact information: [address] [place],  [place]  [address]  [phone]                Reviewed expectations re: course of current medical issues. Questions answered. Outlined signs and symptoms indicating need for more acute intervention. Patient verbalized understanding. After Visit Summary given.  SUBJECTIVE: History from: patient. Lisa Bradford is a 50 y.o. female who reports heavy object falling onto R foot. Immed pain. Able to br weight but with pain. No extremity sensation changes or weakness.  No tx PTA.  Past Surgical History:  Procedure Laterality Date   CERVICAL POLYPECTOMY        OBJECTIVE:  Vitals:   04/27/21 1938  BP: 107/68  Pulse: 60  Resp: 18  Temp: 98.2 F (36.8 C)  TempSrc: Oral  SpO2: 100%    General appearance: alert; no distress HEENT: [place]; AT Neck: supple with FROM Resp: unlabored respirations Extremities: RLE: warm with well perfused appearance; poorly localized moderate tenderness over right dorsal foot, more proximally; without gross deformities; swelling: minimal; bruising: none; ankle ROM: normal CV: brisk extremity capillary refill of RLE; 2+ DP pulse of RLE. Skin: warm and dry; no visible rashes Neurologic: normal sensation and strength of RLE Psychological: alert and cooperative; normal mood and affect  Imaging: DG Foot  Complete Right  Result Date: 04/27/2021 CLINICAL DATA:  Trauma to the right foot. EXAM: RIGHT FOOT COMPLETE - 3+ VIEW COMPARISON:  None. FINDINGS: There is a small triangular bone fragment along the posterior aspect of the medial navicular likely representing an os navicularis. Correlation with point tenderness recommended to exclude an acute fracture. No other fracture identified. There is no dislocation. The bones are well mineralized. No arthritic changes. The soft tissues are unremarkable. IMPRESSION: Os navicularis versus less likely a small acute fracture of the medial navicular. Electronically Signed   By: Elgie Collard M.D.   On: 04/27/2021 20:05     Allergies  Allergen Reactions   Eggs Or Egg-Derived Products Nausea Only   Other     Past Medical History:  Diagnosis Date   Allergy    GERD (gastroesophageal reflux disease) 12/26/2011   Heart murmur    echo in 98   Hypothyroidism    pos antibodies  goiter    THROMBOCYTOPENIA 02/01/2009   Qualifier: Diagnosis of  By: Fabian Sharp MD, Neta Mends    Social History   Socioeconomic History   Marital status: Single    Spouse name: Not on file   Number of children: 0   Years of education: Not on file   Highest education level: Bachelor's degree (e.g., BA, AB, BS)  Occupational History   Not on file  Tobacco Use   Smoking status: Never   Smokeless tobacco: Never  Vaping Use   Vaping Use: Never used  Substance and Sexual Activity   Alcohol use: Yes  Alcohol/week: 0.0 standard drinks    Comment: once every 2 weeks   Drug use: No   Sexual activity: Not Currently    Birth control/protection: I.U.D.  Other Topics Concern   Not on file  Social History Narrative   HH of 1   Pet cat.   ocass exercise .   No tob    rare etoh   caffiene  2 per day   Work  40 hours per week.    Good sleep.    Social Determinants of Health   Financial Resource Strain: Not on file  Food Insecurity: Not on file  Transportation Needs: Not on file   Physical Activity: Not on file  Stress: Not on file  Social Connections: Not on file   Family History  Problem Relation Age of Onset   Cancer Mother        ovarian and cervical   Breast cancer Mother    Cancer Maternal Grandmother        lung   Cancer Maternal Grandfather        lung and pancreatic   Diabetes Maternal Grandfather    Coronary artery disease Father        stent   Diabetes Father    Diabetes Sister    Breast cancer Maternal Aunt    Diabetes Paternal Grandmother    Past Surgical History:  Procedure Laterality Date   CERVICAL POLYPECTOMY         Mardella Layman, MD 05/02/21 [id number]

## 2021-07-05 ENCOUNTER — Other Ambulatory Visit: Payer: Self-pay | Admitting: Internal Medicine

## 2021-09-15 ENCOUNTER — Telehealth: Payer: Self-pay | Admitting: Internal Medicine

## 2021-09-15 NOTE — Telephone Encounter (Signed)
Patient called because she tested positive last night for covid and wants some medication for it. I did let patient know that Dr. Fabian Sharp is out of the office. Patient would like a call back when prescription is sent. I did tell patient that she may have to go to an urgent care.      Please advise

## 2021-09-20 NOTE — Telephone Encounter (Signed)
Pt states she did a phone visit through insurance company & received an antiviral. Pt states her congestion & mild cough lingering with some fatigue. Denies fever or SOB at this time. Pt advised to seek provider care if symptoms return or worsen. Discussed OTC medication options. Pt verb understanding.

## 2022-01-23 LAB — HM PAP SMEAR

## 2022-02-08 ENCOUNTER — Other Ambulatory Visit: Payer: Self-pay | Admitting: Internal Medicine

## 2022-03-12 ENCOUNTER — Encounter: Payer: Self-pay | Admitting: Family Medicine

## 2022-03-12 ENCOUNTER — Ambulatory Visit (INDEPENDENT_AMBULATORY_CARE_PROVIDER_SITE_OTHER): Payer: No Typology Code available for payment source | Admitting: Family Medicine

## 2022-03-12 VITALS — BP 100/60 | HR 60 | Temp 98.3°F | Ht 65.16 in | Wt 153.1 lb

## 2022-03-12 DIAGNOSIS — L259 Unspecified contact dermatitis, unspecified cause: Secondary | ICD-10-CM | POA: Diagnosis not present

## 2022-03-12 DIAGNOSIS — M67449 Ganglion, unspecified hand: Secondary | ICD-10-CM | POA: Diagnosis not present

## 2022-03-12 MED ORDER — PREDNISONE 10 MG PO TABS: ORAL_TABLET | ORAL | 0 refills | Status: DC

## 2022-03-12 NOTE — Progress Notes (Signed)
Established Patient Office Visit  Subjective   Patient ID: Lisa Bradford, female    DOB: 1971-07-18  Age: 51 y.o. MRN: [id number]  Chief Complaint  Patient presents with   Rash    Patient complains of rash, x3 days, Patient reports itchiness and mild pain    HPI   Lisa Bradford is seen with pruritic rash scattered around her upper and lower extremities.  She initially thought these may be bug bites.  She does not spend a lot of time outdoors.  However, she has a dog that is outdoors much of the time.  Her current rash started few days ago.  She has not tried anything topically.  She also has very small cystic lesion which is minimally tender right index finger over the PIP joint dorsally.  She relates small cut with sheet-metal prior to this but she does not think there is any foreign body.  This occurred several months ago back in April.  Past Medical History:  Diagnosis Date   Allergy    GERD (gastroesophageal reflux disease) 12/26/2011   Heart murmur    echo in 98   Hypothyroidism    pos antibodies  goiter    THROMBOCYTOPENIA 02/01/2009   Qualifier: Diagnosis of  By: Fabian Sharp MD, Neta Mends    Past Surgical History:  Procedure Laterality Date   CERVICAL POLYPECTOMY      reports that she has never smoked. She has never used smokeless tobacco. She reports current alcohol use. She reports that she does not use drugs. family history includes Breast cancer in her maternal aunt and mother; Cancer in her maternal grandfather, maternal grandmother, and mother; Coronary artery disease in her father; Diabetes in her father, maternal grandfather, paternal grandmother, and sister. Allergies  Allergen Reactions   Eggs Or Egg-Derived Products Nausea Only   Other     Review of Systems  Constitutional:  Negative for chills and fever.  Skin:  Positive for rash.      Objective:     BP 100/60 (BP Location: Left Arm, Patient Position: Sitting, Cuff Size: Normal)   Pulse 60   Temp 98.3 F  (36.8 C) (Oral)   Ht 5' 5.16" (1.655 m)   Wt 153 lb 1.6 oz (69.4 kg)   SpO2 99%   BMI 25.35 kg/m    Physical Exam Vitals reviewed.  Constitutional:      Appearance: Normal appearance.  Cardiovascular:     Rate and Rhythm: Normal rate and regular rhythm.  Musculoskeletal:     Comments: Right index finger reveals small cystic type lesion which is mobile over the PIP joint.  Full range of motion joint.  Skin:    Comments: She has scattered areas of rash on her hand, upper extremities, and lower extremities.  Erythematous base and slightly raised some with linear distribution.  No pustules.  Nonscaly.  Neurological:     Mental Status: She is alert.      No results found for any visits on 03/12/22.    The 10-year ASCVD risk score (Arnett DK, et al., 2019) is: 0.7%    Assessment & Plan:   #1 probable contact dermatitis with fairly widespread involvement.  We discussed prednisone taper over the next 12 days.  We did discuss possible side effects.  Take with food.  #2 benign appearing cyst right index finger dorsally.  This occurred after local trauma.  No signs of secondary infection.  Full range of motion.  We did discuss possibility of screening for  foreign body with x-ray but she declines.  Follow-up for now   No follow-ups on file.    Evelena Peat, MD

## 2022-04-16 ENCOUNTER — Other Ambulatory Visit: Payer: Self-pay | Admitting: Internal Medicine

## 2022-04-24 NOTE — Progress Notes (Unsigned)
No chief complaint on file.   HPI: Lisa Bradford 51 y.o. come in for Chronic disease management  Lat visit with me march 2022  Thyroid  ROS: See pertinent positives and negatives per HPI.  Past Medical History:  Diagnosis Date   Allergy    GERD (gastroesophageal reflux disease) 12/26/2011   Heart murmur    echo in 98   Hypothyroidism    pos antibodies  goiter    THROMBOCYTOPENIA 02/01/2009   Qualifier: Diagnosis of  By: Fabian Sharp MD, Neta Mends     Family History  Problem Relation Age of Onset   Cancer Mother        ovarian and cervical   Breast cancer Mother    Cancer Maternal Grandmother        lung   Cancer Maternal Grandfather        lung and pancreatic   Diabetes Maternal Grandfather    Coronary artery disease Father        stent   Diabetes Father    Diabetes Sister    Breast cancer Maternal Aunt    Diabetes Paternal Grandmother     Social History   Socioeconomic History   Marital status: Single    Spouse name: Not on file   Number of children: 0   Years of education: Not on file   Highest education level: Bachelor's degree (e.g., BA, AB, BS)  Occupational History   Not on file  Tobacco Use   Smoking status: Never   Smokeless tobacco: Never  Vaping Use   Vaping Use: Never used  Substance and Sexual Activity   Alcohol use: Yes    Alcohol/week: 0.0 standard drinks of alcohol    Comment: once every 2 weeks   Drug use: No   Sexual activity: Not Currently    Birth control/protection: I.U.D.  Other Topics Concern   Not on file  Social History Narrative   HH of 1   Pet cat.   ocass exercise .   No tob    rare etoh   caffiene  2 per day   Work  40 hours per week.    Good sleep.    Social Determinants of Health   Financial Resource Strain: Not on file  Food Insecurity: Not on file  Transportation Needs: No Transportation Needs (05/05/2019)   PRAPARE - Administrator, Civil Service (Medical): No    Lack of Transportation  (Non-Medical): No  Physical Activity: Not on file  Stress: Not on file  Social Connections: Not on file    Outpatient Medications Prior to Visit  Medication Sig Dispense Refill   levonorgestrel (MIRENA) 20 MCG/24HR IUD 1 each by Intrauterine route once.     levothyroxine (SYNTHROID) 100 MCG tablet Take 1 tablet by mouth daily. 30 tablet 0   omeprazole (PRILOSEC) 20 MG capsule Take 20 mg by mouth daily.     predniSONE (DELTASONE) 10 MG tablet Taper as follows  6-6-4-4-4-3-3-3-2-2-1-1 39 tablet 0   No facility-administered medications prior to visit.     EXAM:  There were no vitals taken for this visit.  There is no height or weight on file to calculate BMI.  GENERAL: vitals reviewed and listed above, alert, oriented, appears well hydrated and in no acute distress HEENT: atraumatic, conjunctiva  clear, no obvious abnormalities on inspection of external nose and ears OP : no lesion edema or exudate  NECK: no obvious masses on inspection palpation  LUNGS: clear to auscultation bilaterally,  no wheezes, rales or rhonchi, good air movement CV: HRRR, no clubbing cyanosis or  peripheral edema nl cap refill  MS: moves all extremities without noticeable focal  abnormality PSYCH: pleasant and cooperative, no obvious depression or anxiety Lab Results  Component Value Date   WBC 6.0 11/29/2020   HGB 12.5 11/29/2020   HCT 36.6 11/29/2020   PLT 149.0 (L) 11/29/2020   GLUCOSE 81 11/29/2020   CHOL 153 11/29/2020   TRIG 99.0 11/29/2020   HDL 48.00 11/29/2020   LDLDIRECT 96.0 08/08/2016   LDLCALC 85 11/29/2020   ALT 18 11/29/2020   AST 16 11/29/2020   NA 139 11/29/2020   K 3.9 11/29/2020   CL 103 11/29/2020   CREATININE 1.18 11/29/2020   BUN 22 11/29/2020   CO2 29 11/29/2020   TSH 2.34 11/29/2020   BP Readings from Last 3 Encounters:  03/12/22 100/60  04/27/21 107/68  11/29/20 100/66    ASSESSMENT AND PLAN:  Discussed the following assessment and plan:  Hypothyroidism,  unspecified type  Hyperlipidemia, unspecified hyperlipidemia type  Medication management  -Patient advised to return or notify health care team  if  new concerns arise.  There are no Patient Instructions on file for this visit.   Neta Mends. Yassir Enis M.D.

## 2022-04-25 ENCOUNTER — Other Ambulatory Visit: Payer: Self-pay

## 2022-04-25 ENCOUNTER — Encounter: Payer: Self-pay | Admitting: Internal Medicine

## 2022-04-25 ENCOUNTER — Ambulatory Visit (INDEPENDENT_AMBULATORY_CARE_PROVIDER_SITE_OTHER): Payer: No Typology Code available for payment source | Admitting: Internal Medicine

## 2022-04-25 ENCOUNTER — Other Ambulatory Visit: Payer: Self-pay | Admitting: Internal Medicine

## 2022-04-25 VITALS — BP 98/78 | HR 56 | Temp 98.6°F | Ht 65.25 in | Wt 154.5 lb

## 2022-04-25 DIAGNOSIS — Z0000 Encounter for general adult medical examination without abnormal findings: Secondary | ICD-10-CM

## 2022-04-25 DIAGNOSIS — Z79899 Other long term (current) drug therapy: Secondary | ICD-10-CM

## 2022-04-25 DIAGNOSIS — G8929 Other chronic pain: Secondary | ICD-10-CM

## 2022-04-25 DIAGNOSIS — M545 Low back pain, unspecified: Secondary | ICD-10-CM

## 2022-04-25 DIAGNOSIS — Z23 Encounter for immunization: Secondary | ICD-10-CM

## 2022-04-25 DIAGNOSIS — E039 Hypothyroidism, unspecified: Secondary | ICD-10-CM | POA: Diagnosis not present

## 2022-04-25 DIAGNOSIS — E785 Hyperlipidemia, unspecified: Secondary | ICD-10-CM | POA: Diagnosis not present

## 2022-04-25 LAB — LIPID PANEL
Cholesterol: 170 mg/dL (ref 0–200)
HDL: 60.9 mg/dL (ref >39.00)
LDL Cholesterol: 87 mg/dL (ref 0–99)
NonHDL: 109.1
Total CHOL/HDL Ratio: 3
Triglycerides: 112 mg/dL (ref 0.0–149.0)
VLDL: 22.4 mg/dL (ref 0.0–40.0)

## 2022-04-25 LAB — BASIC METABOLIC PANEL
BUN: 29 mg/dL — ABNORMAL HIGH (ref 6–23)
CO2: 30 mEq/L (ref 19–32)
Calcium: 9.9 mg/dL (ref 8.4–10.5)
Chloride: 101 mEq/L (ref 96–112)
Creatinine, Ser: 1.03 mg/dL (ref 0.40–1.20)
GFR: 62.7 mL/min (ref >60.00)
Glucose, Bld: 79 mg/dL (ref 70–99)
Potassium: 4 mEq/L (ref 3.5–5.1)
Sodium: 139 mEq/L (ref 135–145)

## 2022-04-25 LAB — CBC WITH DIFFERENTIAL/PLATELET
Basophils Absolute: 0 10*3/uL (ref 0.0–0.1)
Basophils Relative: 0.3 % (ref 0.0–3.0)
Eosinophils Absolute: 0.1 10*3/uL (ref 0.0–0.7)
Eosinophils Relative: 1.4 % (ref 0.0–5.0)
HCT: 39.9 % (ref 36.0–46.0)
Hemoglobin: 13.4 g/dL (ref 12.0–15.0)
Lymphocytes Relative: 26.1 % (ref 12.0–46.0)
Lymphs Abs: 1.8 10*3/uL (ref 0.7–4.0)
MCHC: 33.6 g/dL (ref 30.0–36.0)
MCV: 91.5 fl (ref 78.0–100.0)
Monocytes Absolute: 0.5 10*3/uL (ref 0.1–1.0)
Monocytes Relative: 6.8 % (ref 3.0–12.0)
Neutro Abs: 4.4 10*3/uL (ref 1.4–7.7)
Neutrophils Relative %: 65.4 % (ref 43.0–77.0)
Platelets: 158 10*3/uL (ref 150.0–400.0)
RBC: 4.36 Mil/uL (ref 3.87–5.11)
RDW: 12.8 % (ref 11.5–15.5)
WBC: 6.7 10*3/uL (ref 4.0–10.5)

## 2022-04-25 LAB — HEPATIC FUNCTION PANEL
ALT: 22 U/L (ref 0–35)
AST: 24 U/L (ref 0–37)
Albumin: 4.4 g/dL (ref 3.5–5.2)
Alkaline Phosphatase: 47 U/L (ref 39–117)
Bilirubin, Direct: 0.1 mg/dL (ref 0.0–0.3)
Total Bilirubin: 0.5 mg/dL (ref 0.2–1.2)
Total Protein: 7.7 g/dL (ref 6.0–8.3)

## 2022-04-25 LAB — HEMOGLOBIN A1C: Hgb A1c MFr Bld: 5.9 % (ref 4.6–6.5)

## 2022-04-25 LAB — TSH: TSH: 1.42 u[IU]/mL (ref 0.35–5.50)

## 2022-04-25 MED ORDER — LEVOTHYROXINE SODIUM 100 MCG PO TABS: 100.0000 ug | ORAL_TABLET | Freq: Every day | ORAL | 3 refills | Status: DC

## 2022-04-25 NOTE — Progress Notes (Signed)
Thyroid is in range  will refill same dose thyroid medication. Rest  of lab normal or clinically insignificant .  Hg a1c is 5.9  slightly up but no diabetes

## 2022-04-25 NOTE — Patient Instructions (Addendum)
Good to see you today . Nonfasting lab will be on my chart . Will refill med  accordingly.  If all ok yearly check   Will do referral to [facility] about the ongoing back pain .

## 2022-05-02 NOTE — Progress Notes (Signed)
[name] D.[name] [address] [place] [address] Phone: [phone]   Assessment and Plan:     1. Chronic right-sided low back pain without sciatica 2. Chronic bilateral thoracic back pain -Chronic with exacerbation, initial sports medicine visit - Unclear etiology of chronic low back pain that appears concentrated at paraspinal L4-L5 and right SI joint on physical exam.  Patient may have experienced a flare of mild facet arthritis seen X ray when patient fell onto her buttock last winter that has not been able to fully resolve - Start meloxicam 15 mg daily x2 weeks.  If still having pain after 2 weeks, complete 3rd-week of meloxicam. May use remaining meloxicam as needed once daily for pain control.  Do not to use additional NSAIDs while taking meloxicam.  May use Tylenol [phone] mg 2 to 3 times a day for breakthrough pain. -Start HEP - May use Flexeril 5 mg as needed for muscle spasms - X-ray obtained in clinic.  My interpretation: No acute fracture dislocations or vertebral collapses.  Mild facet arthropathy concentrated at L4-L5-S1 - DG Thoracic Spine 2 View; Future  Other orders - meloxicam (MOBIC) 15 MG tablet; Take 1 tablet (15 mg total) by mouth daily. - cyclobenzaprine (FLEXERIL) 5 MG tablet; Take 1 tablet (5 mg total) by mouth at bedtime.    Pertinent previous records reviewed include none   Follow Up: 3 to 4 weeks for reevaluation.  Could consider formal physical therapy versus OMT versus advanced imaging based on patient's symptoms   Subjective:   I, Lisa Bradford, am serving as a Neurosurgeon for Doctor Richardean Sale  Chief Complaint: mid/low back pain  HPI:  05/10/2022 Patient is a 51 year old female complaining of mid/ low back pain. Patient states that she has had lbp more than a year that has gotten worse feb went to a chiro and that helped but the pain is coming back, she goes about once a month the pain is worst  now since she has been going once a month , pain is mostly on the right side , no numbness or tingling, does get radiating pain to the hip sometimes a sharp stabbing pain , wasn't able to stand or walk for long periods of time is affecting ADLs, has not been taking any meds for the pain    Relevant Historical Information: None pertinent  Additional pertinent review of systems negative.   Current Outpatient Medications:    cyclobenzaprine (FLEXERIL) 5 MG tablet, Take 1 tablet (5 mg total) by mouth at bedtime., Disp: 15 tablet, Rfl: 0   levonorgestrel (MIRENA) 20 MCG/24HR IUD, 1 each by Intrauterine route once., Disp: , Rfl:    levothyroxine (SYNTHROID) 100 MCG tablet, Take 1 tablet (100 mcg total) by mouth daily., Disp: 90 tablet, Rfl: 3   meloxicam (MOBIC) 15 MG tablet, Take 1 tablet (15 mg total) by mouth daily., Disp: 30 tablet, Rfl: 0   omeprazole (PRILOSEC) 20 MG capsule, Take 20 mg by mouth daily., Disp: , Rfl:    Objective:     Vitals:   05/10/22 1500  BP: 120/80  Pulse: (!) 59  SpO2: 100%  Weight: 155 lb (70.3 kg)  Height: 5\' 5"  (1.651 m)      Body mass index is 25.79 kg/m.    Physical Exam:    Gen: Appears well, nad, nontoxic and pleasant Psych: Alert and oriented, appropriate mood and affect Neuro: sensation intact, strength is 5/5 in upper and lower  extremities, muscle tone wnl Skin: no susupicious lesions or rashes  Back - Normal skin, Spine with normal alignment and no deformity.   No tenderness to vertebral process palpation.   Trace paraspinous muscles tenderness on right L4-L5 and without spasm Bilateral SI joints are NTTP, however patient states that her pain generally feels located around the area of right SI Straight leg raise negative Trendelenberg negative    Electronically signed by:  [name] [name] 4:13 PM 05/10/22

## 2022-05-10 ENCOUNTER — Ambulatory Visit (INDEPENDENT_AMBULATORY_CARE_PROVIDER_SITE_OTHER): Payer: No Typology Code available for payment source

## 2022-05-10 ENCOUNTER — Ambulatory Visit (INDEPENDENT_AMBULATORY_CARE_PROVIDER_SITE_OTHER): Payer: No Typology Code available for payment source | Admitting: Sports Medicine

## 2022-05-10 VITALS — BP 120/80 | HR 59 | Ht 65.0 in | Wt 155.0 lb

## 2022-05-10 DIAGNOSIS — M546 Pain in thoracic spine: Secondary | ICD-10-CM

## 2022-05-10 DIAGNOSIS — G8929 Other chronic pain: Secondary | ICD-10-CM

## 2022-05-10 DIAGNOSIS — M545 Low back pain, unspecified: Secondary | ICD-10-CM

## 2022-05-10 DIAGNOSIS — M549 Dorsalgia, unspecified: Secondary | ICD-10-CM | POA: Diagnosis not present

## 2022-05-10 MED ORDER — CYCLOBENZAPRINE HCL 5 MG PO TABS: 5.0000 mg | ORAL_TABLET | Freq: Every day | ORAL | 0 refills | Status: AC

## 2022-05-10 MED ORDER — MELOXICAM 15 MG PO TABS: 15.0000 mg | ORAL_TABLET | Freq: Every day | ORAL | 0 refills | Status: AC

## 2022-05-10 NOTE — Patient Instructions (Addendum)
Good to see you  - Start meloxicam 15 mg daily x2 weeks.  If still having pain after 2 weeks, complete 3rd-week of meloxicam. May use remaining meloxicam as needed once daily for pain control.  Do not to use additional NSAIDs while taking meloxicam.  May use Tylenol [phone] 2 to 3 times a day for breakthrough pain. Low back HEP  Flexeril 5 mg nightly as needed for muscle spasms 3-4 week follow up

## 2022-05-30 NOTE — Progress Notes (Signed)
[facility] [address] [place] [address] Phone: [phone]   Assessment and Plan:     1. Chronic right-sided low back pain without sciatica 2. Chronic bilateral thoracic back pain  1. Chronic right-sided low back pain without sciatica 3. Somatic dysfunction of lumbar region 4. Somatic dysfunction of pelvic region 5. Somatic dysfunction of sacral region -Chronic with exacerbation, subsequent sports medicine visit - Only mild improvement in symptoms after completion of NSAID course, intermittent use of muscle relaxer, HEP - Discontinue meloxicam and Flexeril at this time - Start physical therapy geared at low back, SI - Patient elected for initial OMT today.  Tolerated well per note below. - Decision today to treat with OMT was based on Physical Exam  After verbal consent patient was treated with HVLA (high velocity low amplitude), ME (muscle energy), FPR (flex positional release), ST (soft tissue), PC/PD (Pelvic Compression/ Pelvic Decompression) techniques in sacrum, lumbar, and pelvic areas. Patient tolerated the procedure well with improvement in symptoms.  Patient educated on potential side effects of soreness and recommended to rest, hydrate, and use Tylenol as needed for pain control.   Pertinent previous records reviewed include none   Follow Up: 2 to 3 weeks for reevaluation.  If no improvement or worsening of symptoms, could consider repeat OMT versus SI joint injections versus advanced imaging based on patient's symptoms   Subjective:   I, Lisa Bradford, am serving as a Neurosurgeon for Lisa Bradford   Chief Complaint: mid/low back pain   HPI:  05/10/2022 Patient is a 51 year old female complaining of mid/ low back pain. Patient states that she has had lbp more than a year that has gotten worse feb went to a chiro and that helped but the pain is coming back, she goes about once a month the pain is worst now  since she has been going once a month , pain is mostly on the right side , no numbness or tingling, does get radiating pain to the hip sometimes a sharp stabbing pain , wasn't able to stand or walk for long periods of time is affecting ADLs, has not been taking any meds for the pain   05/31/2022 Patient states that the pain is a little bit better but it is still there , hurts more today than yesterday , is able to get lower in her squats     Relevant Historical Information: None pertinent  Additional pertinent review of systems negative.   Current Outpatient Medications:    cyclobenzaprine (FLEXERIL) 5 MG tablet, Take 1 tablet (5 mg total) by mouth at bedtime., Disp: 15 tablet, Rfl: 0   levonorgestrel (MIRENA) 20 MCG/24HR IUD, 1 each by Intrauterine route once., Disp: , Rfl:    levothyroxine (SYNTHROID) 100 MCG tablet, Take 1 tablet (100 mcg total) by mouth daily., Disp: 90 tablet, Rfl: 3   meloxicam (MOBIC) 15 MG tablet, Take 1 tablet (15 mg total) by mouth daily., Disp: 30 tablet, Rfl: 0   omeprazole (PRILOSEC) 20 MG capsule, Take 20 mg by mouth daily., Disp: , Rfl:    Objective:     Vitals:   05/31/22 1559  BP: 110/80  Pulse: 69  SpO2: 100%  Weight: 154 lb (69.9 kg)  Height: 5\' 5"  (1.651 m)      Body mass index is 25.63 kg/m.    Physical Exam:    General: Well-appearing, cooperative, sitting comfortably in no acute distress.   OMT Physical Exam:  ASIS Compression Test: Positive Right Sacrum: Positive sphinx, TTP significantly right sacral base, mildly left sacral base Lumbar: TTP paraspinal, L1-3 RRSL Pelvis: Right anterior innominate    Electronically signed by:  [facility] 4:21 PM 05/31/22

## 2022-05-31 ENCOUNTER — Ambulatory Visit (INDEPENDENT_AMBULATORY_CARE_PROVIDER_SITE_OTHER): Payer: No Typology Code available for payment source | Admitting: Sports Medicine

## 2022-05-31 VITALS — BP 110/80 | HR 69 | Ht 65.0 in | Wt 154.0 lb

## 2022-05-31 DIAGNOSIS — M545 Low back pain, unspecified: Secondary | ICD-10-CM | POA: Diagnosis not present

## 2022-05-31 DIAGNOSIS — M9904 Segmental and somatic dysfunction of sacral region: Secondary | ICD-10-CM

## 2022-05-31 DIAGNOSIS — G8929 Other chronic pain: Secondary | ICD-10-CM | POA: Diagnosis not present

## 2022-05-31 DIAGNOSIS — M546 Pain in thoracic spine: Secondary | ICD-10-CM

## 2022-05-31 DIAGNOSIS — M9905 Segmental and somatic dysfunction of pelvic region: Secondary | ICD-10-CM | POA: Diagnosis not present

## 2022-05-31 DIAGNOSIS — M9903 Segmental and somatic dysfunction of lumbar region: Secondary | ICD-10-CM

## 2022-05-31 NOTE — Patient Instructions (Addendum)
Good to see you  PT referral  Discontinue flexeril and meloxicam  2-3 week follow up

## 2022-06-08 ENCOUNTER — Ambulatory Visit: Payer: No Typology Code available for payment source | Attending: Sports Medicine | Admitting: Physical Therapy

## 2022-06-08 ENCOUNTER — Encounter: Payer: Self-pay | Admitting: Physical Therapy

## 2022-06-08 DIAGNOSIS — G8929 Other chronic pain: Secondary | ICD-10-CM | POA: Diagnosis not present

## 2022-06-08 DIAGNOSIS — M2560 Stiffness of unspecified joint, not elsewhere classified: Secondary | ICD-10-CM | POA: Insufficient documentation

## 2022-06-08 DIAGNOSIS — M545 Low back pain, unspecified: Secondary | ICD-10-CM | POA: Insufficient documentation

## 2022-06-08 DIAGNOSIS — M6281 Muscle weakness (generalized): Secondary | ICD-10-CM | POA: Insufficient documentation

## 2022-06-08 DIAGNOSIS — M5459 Other low back pain: Secondary | ICD-10-CM | POA: Insufficient documentation

## 2022-06-08 DIAGNOSIS — M546 Pain in thoracic spine: Secondary | ICD-10-CM | POA: Diagnosis not present

## 2022-06-08 NOTE — Therapy (Addendum)
OUTPATIENT PHYSICAL THERAPY THORACOLUMBAR EVALUATION   Patient Name: Lisa Bradford MRN: [id number] DOB:Jul 27, 1971, 51 y.o., female Today's Date: 06/08/2022   PT End of Session - 06/08/22 1023     Visit Number 1    Number of Visits 8    Date for PT Re-Evaluation 07/20/22    Authorization Type [employer] ([employer])    PT Start Time 1020    PT Stop Time 1107    PT Time Calculation (min) 47 min    Activity Tolerance Patient tolerated treatment well    Behavior During Therapy WFL for tasks assessed/performed             Past Medical History:  Diagnosis Date   Allergy    GERD (gastroesophageal reflux disease) 12/26/2011   Heart murmur    echo in 98   Hypothyroidism    pos antibodies  goiter    THROMBOCYTOPENIA 02/01/2009   Qualifier: Diagnosis of  By: Fabian Sharp MD, Neta Mends    Past Surgical History:  Procedure Laterality Date   CERVICAL POLYPECTOMY     Patient Active Problem List   Diagnosis Date Noted   Screening breast examination 05/05/2019   Breast pain, right 05/05/2019   Plantar fasciitis of left foot 12/30/2013   Obesity (BMI 30-39.9) 12/30/2013   Visit for preventive health examination 12/30/2013   Hyperlipidemia 12/30/2013   Recurrent acute tonsillitis 03/13/2013   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   SUBJECTIVE:                                                                                                                                                                                            SUBJECTIVE STATEMENT:   Patient is a 51 yo female with chronic low back pain which began last winter, fell and landed on her tail on icy step.  She has improved since that time with chiropractic and DO care.  Pain does not radiate and she denies weakness.  She works out 5 days a week (does [employer]).  She can't go as low in squat and has pressure in her back when she does so.  She  works through pain most of the time.  She has  difficulty walking > 30 min and standing long periods. 5-10 min.  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  She no longer takes meds for pain .    PERTINENT HISTORY:  See above   PAIN:  Are you having pain? Yes: NPRS scale: 5/10 Pain location: central low back across at times  Pain description: achy  Aggravating factors: walking, squatting  Relieving factors: heating , pad, meds , chiro helped, massage but had a tough tie getting off table.   Pain can be 8/10-9/10 with walking too much.   PRECAUTIONS: None  WEIGHT BEARING RESTRICTIONS No  FALLS:  Has patient fallen in last 6 months? No , does not feel worried about falling , balance is OK   LIVING ENVIRONMENT: Lives with: lives alone Lives in: House/apartment Stairs: Yes: External: 3 steps; on right going up Has following equipment at home: None  OCCUPATION: Full time office mgr  PLOF: Independent, Vocation/Vocational requirements: sedentary, and Leisure: workouts, walking the dog  PATIENT GOALS:  Pt would like to be pain free.    OBJECTIVE:   DIAGNOSTIC FINDINGS:  Mild to moderate anterior disc space narrowing of the upper thoracic spine.    Mild-to-moderate L5-S1 and mild posterior L4-5 disc space narrowing.    PATIENT SURVEYS:  FOTO 56% (risk adj)   SCREENING FOR RED FLAGS: Bowel or bladder incontinence: No Spinal tumors: No Cauda equina syndrome:  No Compression fracture: No Abdominal aneurysm: No  COGNITION:  Overall cognitive status: Within functional limits for tasks assessed     SENSATION: WFL  MUSCLE LENGTH: Hamstrings: WFL Thomas test:WFL   POSTURE: decreased lumbar lordosis   PALPATION: TTP centrally L3-L4-L5, no soreness in glutes   LUMBAR ROM:   Active  A/PROM  eval  Flexion WNL  Extension 25%  Right lateral flexion 10  Left lateral flexion 10  Right rotation Min pain WNL   Left rotation Min pain WNL    (Blank rows = not tested)  LOWER EXTREMITY ROM:    WNL throughout LEs  AROM  Right eval Left eval  Hip flexion    Hip extension    Hip abduction    Hip adduction    Hip internal rotation    Hip external rotation    Knee flexion    Knee extension    Ankle dorsiflexion    Ankle plantarflexion    Ankle inversion    Ankle eversion     (Blank rows = not tested)  LOWER EXTREMITY MMT:    MMT Right eval Left eval  Hip flexion 4+ 4+  Hip extension 3- 3-  Hip abduction 3- 3-  Hip adduction    Hip internal rotation    Hip external rotation    Knee flexion 5 5  Knee extension 5 5  Ankle dorsiflexion 5 5  Ankle plantarflexion    Ankle inversion    Ankle eversion     (Blank rows = not tested)  LUMBAR SPECIAL TESTS:  Straight leg raise test: Negative Neg Trendelenburg   FUNCTIONAL TESTS:  Squat : poor hip hinge, quad dominant  GAIT: Distance walked: 150 Assistive device utilized: None Level of assistance: I   Comments: no deviations noted     TODAY'S TREATMENT  PT eval Squat form check and cues HEP, core    PATIENT EDUCATION:  Education details: HEP, POC, squat form , core  Person educated: Patient Education method: Explanation, Demonstration, Verbal cues, and Handouts Education comprehension: verbalized understanding and returned demonstration   HOME EXERCISE PROGRAM: Access Code: [email or web address] URL: [email or web address] Date: 06/08/2022 Prepared by:  Karie Mainland  Exercises - Sidelying Hip Abduction  - 2 x daily - 7 x weekly - 2 sets - 10 reps - 5 hold - Clamshell with Resistance  - 2 x daily - 7 x weekly - 2 sets - 10 reps - 5 hold - Quadruped on Forearms Hip Extension  - 2 x daily - 7 x weekly - 2 sets - 10 reps - 5 hold - Squat with Chair Touch  - 2 x daily - 7 x weekly - 2 sets - 10 reps - 5 hold  ASSESSMENT:  CLINICAL IMPRESSION: Patient is a 51 y.o. female who was seen today for physical therapy evaluation and treatment for low back pain. She has significantly weak hips and poor squat form.  She will benefit from skilled PT to work on form with exercises and strengthening progressively.    OBJECTIVE IMPAIRMENTS decreased mobility, difficulty walking, decreased ROM, decreased strength, hypomobility, increased fascial restrictions, postural dysfunction, and pain.   ACTIVITY LIMITATIONS standing, squatting, and locomotion level  PARTICIPATION LIMITATIONS: shopping, community activity, occupation, and fitness  PERSONAL FACTORS Time since onset of injury/illness/exacerbation are also affecting patient's functional outcome.   REHAB POTENTIAL: Excellent  CLINICAL DECISION MAKING: Stable/uncomplicated  EVALUATION COMPLEXITY: Low   GOALS:   LONG TERM GOALS: Target date: 07/20/2022  Pt will be I with HEP for hips, core  Baseline:  Goal status: INITIAL  2.  Pt will be able to show 4/5 or better strength in hips  Baseline: 3-/5 to 3 Goal status: INITIAL  3.  Pt will improve FOTO core to 65% or better to show functional improvement Baseline:  Goal status: INITIAL  4.  Pt will be able to squat with 20-25 lbs and no increase in back pain .  Baseline:  Goal status: INITIAL  5.  Pt will be able to walk for 30 min with min increase in back pain < 3/10  Baseline:  Goal status: INITIAL   PLAN: PT FREQUENCY: 2x/week  PT DURATION: 6 weeks  PLANNED INTERVENTIONS: Therapeutic exercises, Therapeutic activity,  Neuromuscular re-education, Balance training, Patient/Family education, Self Care, Joint mobilization, Spinal mobilization, Cryotherapy, Moist heat, Manual therapy, and Re-evaluation.  ADDENDED TO INCLUDE TPDN to POC.  07/02/22.   PLAN FOR NEXT SESSION: check HEP , body weight squat    Myha Arizpe, PT 06/08/2022, 12:24 PM  Karie Mainland, PT 06/08/22 12:52 PM Phone: [phone] Fax: [phone]

## 2022-06-12 ENCOUNTER — Encounter: Payer: Self-pay | Admitting: Physical Therapy

## 2022-06-12 ENCOUNTER — Ambulatory Visit: Payer: No Typology Code available for payment source | Admitting: Physical Therapy

## 2022-06-12 DIAGNOSIS — M5459 Other low back pain: Secondary | ICD-10-CM

## 2022-06-12 DIAGNOSIS — M6281 Muscle weakness (generalized): Secondary | ICD-10-CM

## 2022-06-12 DIAGNOSIS — M2560 Stiffness of unspecified joint, not elsewhere classified: Secondary | ICD-10-CM

## 2022-06-12 NOTE — Therapy (Signed)
OUTPATIENT PHYSICAL THERAPY TREATMENT NOTE   Patient Name: Lisa Bradford MRN: [id number] DOB:[birth year], 51 y.o., female Today's Date: 06/12/2022  PCP: Richardean Sale, DO   REFERRING PROVIDER: Madelin Headings   END OF SESSION:   PT End of Session - 06/12/22 1153     Visit Number 2    Number of Visits 8    Date for PT Re-Evaluation 07/20/22    Authorization Type [employer] ([place])    PT Start Time 1150    PT Stop Time 1230    PT Time Calculation (min) 40 min    Activity Tolerance Patient tolerated treatment well    Behavior During Therapy [facility] for tasks assessed/performed             Past Medical History:  Diagnosis Date   Allergy    GERD (gastroesophageal reflux disease) 12/26/2011   Heart murmur    echo in 98   Hypothyroidism    pos antibodies  goiter    THROMBOCYTOPENIA 02/01/2009   Qualifier: Diagnosis of  By: Fabian Sharp MD, Neta Mends    Past Surgical History:  Procedure Laterality Date   CERVICAL POLYPECTOMY     Patient Active Problem List   Diagnosis Date Noted   Screening breast examination 05/05/2019   Breast pain, right 05/05/2019   Plantar fasciitis of left foot 12/30/2013   Obesity (BMI 30-39.9) 12/30/2013   Visit for preventive health examination 12/30/2013   Hyperlipidemia 12/30/2013   Recurrent acute tonsillitis 03/13/2013   Mycotic toenails 12/26/2011   Allergic rhinitis 12/13/2008   Hypothyroidism 10/08/2007   NECK PAIN 10/08/2007   HEADACHE 10/08/2007    REFERRING DIAG:M54.50,G89.29 (ICD-10-CM) - Chronic right-sided low back pain without sciatica M54.6,G89.29 (ICD-10-CM) - Chronic bilateral thoracic back pain   THERAPY DIAG:  Other low back pain  Joint stiffness of spine  Muscle weakness (generalized)  Rationale for Evaluation and Treatment Rehabilitation  PERTINENT HISTORY: See above   PRECAUTIONS: none   SUBJECTIVE: I am sore.  Did a AM workout.   PAIN:  Are you having pain? Yes: NPRS scale: 5/10 Pain location:  central mid low back  Pain description: sore  Aggravating factors: squatting , walking and standing incr pain  Relieving factors: nothing that I have found other than sitting    OBJECTIVE: (objective measures completed at initial evaluation unless otherwise dated)  DIAGNOSTIC FINDINGS:  Mild to moderate anterior disc space narrowing of the upper thoracic spine.     Mild-to-moderate L5-S1 and mild posterior L4-5 disc space narrowing.     PATIENT SURVEYS:  FOTO 56% (risk adj)    SCREENING FOR RED FLAGS: Bowel or bladder incontinence: No Spinal tumors: No Cauda equina syndrome: No Compression fracture: No Abdominal aneurysm: No   COGNITION:           Overall cognitive status: Within functional limits for tasks assessed                          SENSATION: WFL   MUSCLE LENGTH: Hamstrings: WFL Thomas test:WFL    POSTURE: decreased lumbar lordosis    PALPATION: TTP centrally L3-L4-L5, no soreness in glutes    LUMBAR ROM:    Active  A/PROM  eval  Flexion WNL  Extension 25%  Right lateral flexion 10  Left lateral flexion 10  Right rotation Min pain WNL   Left rotation Min pain WNL    (Blank rows = not tested)   LOWER EXTREMITY ROM:  WNL throughout LEs  AROM  Right eval Left eval  Hip flexion      Hip extension      Hip abduction      Hip adduction      Hip internal rotation      Hip external rotation      Knee flexion      Knee extension      Ankle dorsiflexion      Ankle plantarflexion      Ankle inversion      Ankle eversion       (Blank rows = not tested)   LOWER EXTREMITY MMT:     MMT Right eval Left eval  Hip flexion 4+ 4+  Hip extension 3- 3-  Hip abduction 3- 3-  Hip adduction      Hip internal rotation      Hip external rotation      Knee flexion 5 5  Knee extension 5 5  Ankle dorsiflexion 5 5  Ankle plantarflexion      Ankle inversion      Ankle eversion       (Blank rows = not tested)   LUMBAR SPECIAL TESTS:  Straight leg  raise test: Negative Neg Trendelenburg    FUNCTIONAL TESTS:  Squat : poor hip hinge, quad dominant   GAIT: Distance walked: 150 Assistive device utilized: None Level of assistance: I          Comments: no deviations noted        TODAY'S TREATMENT  PT eval Squat form check and cues HEP, core     [facility] Adult PT Treatment:                                                DATE: 06/12/22 Therapeutic Exercise: Bridge x 15 SLR x 15  Clam x 15  Hip hinging sit to stand progressed to squat with 10 lbs min pain in back with this  Wall squat with ball no pain in back  Wall sit 10 lbs with quad pushing  Dead lift double leg blue band  x 10, then with 10 lbs less pain x 10  Dead lift stagger stance Rt x 10 and Lt. X 10  Glider lunge on wall x 15  Step ups glute x 10 each  Hip abd x15 standing  Figure 4 for hip stretching   PATIENT EDUCATION:  Education details: HEP, POC, squat form , core            Person educated: Patient Education method: Explanation, Demonstration, Verbal cues, and Handouts Education comprehension: verbalized understanding and returned demonstration     HOME EXERCISE PROGRAM: Access Code: [email or web address] URL: [email or web address] Date: 06/08/2022 Prepared by: Raeford Razor   Exercises - Sidelying Hip Abduction  - 2 x daily - 7 x weekly - 2 sets - 10 reps - 5 hold - Clamshell with Resistance  - 2 x daily - 7 x weekly - 2 sets - 10 reps - 5 hold - Quadruped on Forearms Hip Extension  - 2 x daily - 7 x weekly - 2 sets - 10 reps - 5 hold - Squat with Chair Touch  - 2 x daily - 7 x weekly - 2 sets - 10 reps - 5 hold   ASSESSMENT:   CLINICAL IMPRESSION: Patient did well, her squat form has improved with  min cues and practice at home.  She notes no pain in low back with wall squat.  She did have good dead lift form but some mild back pain with extension phase of lift.  She will continue to benefit from glute strengthening exercises to support her  lumbosacral spine with lifting, activity, walking. Cont POC.    OBJECTIVE IMPAIRMENTS decreased mobility, difficulty walking, decreased ROM, decreased strength, hypomobility, increased fascial restrictions, postural dysfunction, and pain.    ACTIVITY LIMITATIONS standing, squatting, and locomotion level   PARTICIPATION LIMITATIONS: shopping, community activity, occupation, and fitness   PERSONAL FACTORS Time since onset of injury/illness/exacerbation are also affecting patient's functional outcome.    REHAB POTENTIAL: Excellent   CLINICAL DECISION MAKING: Stable/uncomplicated   EVALUATION COMPLEXITY: Low     GOALS:     LONG TERM GOALS: Target date: 07/20/2022   Pt will be I with HEP for hips, core  Baseline:  Goal status: INITIAL   2.  Pt will be able to show 4/5 or better strength in hips  Baseline: 3-/5 to 3 Goal status: INITIAL   3.  Pt will improve FOTO core to 65% or better to show functional improvement Baseline:  Goal status: INITIAL   4.  Pt will be able to squat with 20-25 lbs and no increase in back pain .  Baseline:  Goal status: INITIAL   5.  Pt will be able to walk for 30 min with min increase in back pain < 3/10  Baseline:  Goal status: INITIAL     PLAN: PT FREQUENCY: 2x/week   PT DURATION: 6 weeks   PLANNED INTERVENTIONS: Therapeutic exercises, Therapeutic activity, Neuromuscular re-education, Balance training, Patient/Family education, Self Care, Joint mobilization, Spinal mobilization, Cryotherapy, Moist heat, Manual therapy, and Re-evaluation.   PLAN FOR NEXT SESSION: check HEP , body weight squat      Tita Terhaar, PT 06/08/2022, 12:24 PM  Karie Mainland, PT 06/12/22 3:40 PM Phone: [phone] Fax: [phone]

## 2022-06-14 ENCOUNTER — Ambulatory Visit: Payer: No Typology Code available for payment source | Admitting: Physical Therapy

## 2022-06-14 ENCOUNTER — Encounter: Payer: Self-pay | Admitting: Physical Therapy

## 2022-06-14 DIAGNOSIS — M6281 Muscle weakness (generalized): Secondary | ICD-10-CM

## 2022-06-14 DIAGNOSIS — M2560 Stiffness of unspecified joint, not elsewhere classified: Secondary | ICD-10-CM

## 2022-06-14 DIAGNOSIS — M5459 Other low back pain: Secondary | ICD-10-CM | POA: Diagnosis not present

## 2022-06-14 NOTE — Therapy (Signed)
OUTPATIENT PHYSICAL THERAPY TREATMENT NOTE   Patient Name: Lisa Bradford MRN: [id number] DOB:[birth year], 51 y.o., female Today's Date: 06/14/2022  PCP: Glennon Mac, DO   REFERRING PROVIDER: Burnis Medin   END OF SESSION:   PT End of Session - 06/14/22 1021     Visit Number 3    Number of Visits 8    Date for PT Re-Evaluation 07/20/22    Authorization Type [employer] ([place] [employer])    PT Start Time 1017    PT Stop Time 1104    PT Time Calculation (min) 47 min    Activity Tolerance Patient tolerated treatment well    Behavior During Therapy WFL for tasks assessed/performed              Past Medical History:  Diagnosis Date   Allergy    GERD (gastroesophageal reflux disease) 12/26/2011   Heart murmur    echo in 98   Hypothyroidism    pos antibodies  goiter    THROMBOCYTOPENIA 02/01/2009   Qualifier: Diagnosis of  By: Regis Bill MD, Standley Brooking    Past Surgical History:  Procedure Laterality Date   CERVICAL POLYPECTOMY     Patient Active Problem List   Diagnosis Date Noted   Screening breast examination 05/05/2019   Breast pain, right 05/05/2019   Plantar fasciitis of left foot 12/30/2013   Obesity (BMI 30-39.9) 12/30/2013   Visit for preventive health examination 12/30/2013   Hyperlipidemia 12/30/2013   Recurrent acute tonsillitis 03/13/2013   Mycotic toenails 12/26/2011   Allergic rhinitis 12/13/2008   Hypothyroidism 10/08/2007   NECK PAIN 10/08/2007   HEADACHE 10/08/2007    REFERRING DIAG:M54.50,G89.29 (ICD-10-CM) - Chronic right-sided low back pain without sciatica M54.6,G89.29 (ICD-10-CM) - Chronic bilateral thoracic back pain   THERAPY DIAG:  Other low back pain  Joint stiffness of spine  Muscle weakness (generalized)  Rationale for Evaluation and Treatment Rehabilitation  PERTINENT HISTORY: See above   PRECAUTIONS: none   SUBJECTIVE: I am sore.  Did a AM workout.   PAIN:  Are you having pain? Yes: NPRS scale: 5/10 Pain location:  central mid low back  Pain description: sore  Aggravating factors: squatting , walking and standing incr pain  Relieving factors: nothing that I have found other than sitting    OBJECTIVE: (objective measures completed at initial evaluation unless otherwise dated)  DIAGNOSTIC FINDINGS:  Mild to moderate anterior disc space narrowing of the upper thoracic spine.     Mild-to-moderate L5-S1 and mild posterior L4-5 disc space narrowing.     PATIENT SURVEYS:  FOTO 56% (risk adj)    SCREENING FOR RED FLAGS: Bowel or bladder incontinence: No Spinal tumors: No Cauda equina syndrome: No Compression fracture: No Abdominal aneurysm: No   COGNITION:           Overall cognitive status: Within functional limits for tasks assessed                          SENSATION: WFL   MUSCLE LENGTH: Hamstrings: WFL Thomas test:WFL    POSTURE: decreased lumbar lordosis    PALPATION: TTP centrally L3-L4-L5, no soreness in glutes    LUMBAR ROM:    Active  A/PROM  eval  Flexion WNL  Extension 25%  Right lateral flexion 10  Left lateral flexion 10  Right rotation Min pain WNL   Left rotation Min pain WNL    (Blank rows = not tested)   LOWER EXTREMITY ROM:  WNL throughout LEs  AROM  Right eval Left eval  Hip flexion      Hip extension      Hip abduction      Hip adduction      Hip internal rotation      Hip external rotation      Knee flexion      Knee extension      Ankle dorsiflexion      Ankle plantarflexion      Ankle inversion      Ankle eversion       (Blank rows = not tested)   LOWER EXTREMITY MMT:     MMT Right eval Left eval  Hip flexion 4+ 4+  Hip extension 3- 3-  Hip abduction 3- 3-  Hip adduction      Hip internal rotation      Hip external rotation      Knee flexion 5 5  Knee extension 5 5  Ankle dorsiflexion 5 5  Ankle plantarflexion      Ankle inversion      Ankle eversion       (Blank rows = not tested)   LUMBAR SPECIAL TESTS:  Straight leg  raise test: Negative Neg Trendelenburg    FUNCTIONAL TESTS:  Squat : poor hip hinge, quad dominant   GAIT: Distance walked: 150 Assistive device utilized: None Level of assistance: I          Comments: no deviations noted        TODAY'S TREATMENT  PT eval Squat form check and cues HEP, core    [facility] Adult PT Treatment:                                                DATE: 06/14/22 Therapeutic Exercise: Elliptical 6 min , level  Quads stretch standing  x 1 each x 30  sec  Piriformis 3 x 30 sec ( knee to opp shoulder)  Cat/cow, child pose  Hamstring 1 x 30 sec supine , has seated  ITB 1 x 30 sec  Seated figure 4 , 1 x 30 sec  Hip abduction (this is an overlap) Hip extension 2.5 lbs cuff  x 45 sec then bent knee (donkey kick ) x 45 sec  Sidelying hip abduction cuff wgt x 15 added forward /back motion (kick) x 8  Pilates Reformer:   Supine arms and abs 1 Red 1 Yellow Arcs, single leg stretch ,  upper ab curl Bridging 2 red 1 blue x 10  Footwork 2 red 1 blue 1 yellow Parallel, heels and then toes Pilates V and then Sumo on heels , all about 15 reps  Self Care: Discussed strength ex vs stretching and flexibility Dr. Marisue [place] HEP taking too long in addition to PT.  And balancing her home workouts.      [facility] Adult PT Treatment:                                                DATE: 06/12/22 Therapeutic Exercise: Bridge x 15 SLR x 15  Clam x 15  Hip hinging sit to stand progressed to squat with 10 lbs min pain in back with this  Wall squat with ball no  pain in back  Wall sit 10 lbs with quad pushing  Dead lift double leg blue band  x 10, then with 10 lbs less pain x 10  Dead lift stagger stance Rt x 10 and Lt. X 10  Glider lunge on wall x 15  Step ups glute x 10 each  Hip abd x15 standing  Figure 4 for hip stretching   PATIENT EDUCATION:  Education details: HEP, POC, squat form , core            Person educated: Patient Education method: Explanation, Demonstration, Verbal  cues, and Handouts Education comprehension: verbalized understanding and returned demonstration     HOME EXERCISE PROGRAM: Access Code: [email or web address] URL: [email or web address] Date: 06/08/2022 Prepared by: Raeford Razor  Access Code: [email or web address] URL: [email or web address] Date: 06/14/2022 Prepared by: Raeford Razor  Exercises - Sidelying Hip Abduction  - 2 x daily - 7 x weekly - 2 sets - 10 reps - 5 hold - Clamshell with Resistance  - 2 x daily - 7 x weekly - 2 sets - 10 reps - 5 hold - Quadruped on Forearms Hip Extension  - 2 x daily - 7 x weekly - 2 sets - 10 reps - 5 hold - Squat with Chair Touch  - 2 x daily - 7 x weekly - 2 sets - 10 reps - 5 hold - Standing Hip Abduction with Counter Support  - 1 x daily - 7 x weekly - 2 sets - 10 reps - 30 hold - Reverse Lunge on Slider  - 1 x daily - 7 x weekly - 2 sets - 10 reps - Standing Quadriceps Stretch  - 1 x daily - 7 x weekly - 1 sets - 3 reps - 30 hold - Supine Hamstring Stretch with Strap  - 1 x daily - 7 x weekly - 1 sets - 3 reps - 30 hold - Supine ITB Stretch with Strap  - 1 x daily - 7 x weekly - 1 sets - 3 reps - 30 hold - Supine Piriformis Stretch with Leg Straight  - 1 x daily - 7 x weekly - 1 sets - 3 reps - 30 hold  ASSESSMENT:   CLINICAL IMPRESSION: Patient demonstrated a few reps of her HEP given to her by MD, this was condensed and streamlined with PT HEP.  Cont to focus on glute strength .  Introduced to Research officer, political party for light core work too.  Cont POC. No pain increase with amt level exercises.    OBJECTIVE IMPAIRMENTS decreased mobility, difficulty walking, decreased ROM, decreased strength, hypomobility, increased fascial restrictions, postural dysfunction, and pain.    ACTIVITY LIMITATIONS standing, squatting, and locomotion level   PARTICIPATION LIMITATIONS: shopping, community activity, occupation, and fitness   PERSONAL FACTORS Time since onset of injury/illness/exacerbation are also  affecting patient's functional outcome.    REHAB POTENTIAL: Excellent   CLINICAL DECISION MAKING: Stable/uncomplicated   EVALUATION COMPLEXITY: Low     GOALS:     LONG TERM GOALS: Target date: 07/20/2022   Pt will be I with HEP for hips, core  Baseline:  Goal status: INITIAL   2.  Pt will be able to show 4/5 or better strength in hips  Baseline: 3-/5 to 3 Goal status: INITIAL   3.  Pt will improve FOTO core to 65% or better to show functional improvement Baseline:  Goal status: INITIAL   4.  Pt will be able to squat with 20-25 lbs and no increase in back pain .  Baseline:  Goal status: INITIAL   5.  Pt will be able to walk for 30 min with min increase in back pain < 3/10  Baseline:  Goal status: INITIAL     PLAN: PT FREQUENCY: 2x/week   PT DURATION: 6 weeks   PLANNED INTERVENTIONS: Therapeutic exercises, Therapeutic activity, Neuromuscular re-education, Balance training, Patient/Family education, Self Care, Joint mobilization, Spinal mobilization, Cryotherapy, Moist heat, Manual therapy, and Re-evaluation.   PLAN FOR NEXT SESSION: check HEP , body weight squat , glute strength.  Progress lifts. Pilates?       Raeford Razor, PT 06/14/22 2:33 PM Phone: [phone] Fax: [phone]

## 2022-06-15 ENCOUNTER — Ambulatory Visit: Payer: No Typology Code available for payment source | Admitting: Physical Therapy

## 2022-06-18 NOTE — Therapy (Unsigned)
OUTPATIENT PHYSICAL THERAPY TREATMENT NOTE   Patient Name: Lisa Bradford MRN: [id number] DOB:07-Oct-1970, 51 y.o., female Today's Date: 06/19/2022  PCP: Richardean Sale, DO   REFERRING PROVIDER: Madelin Headings   END OF SESSION:   PT End of Session - 06/19/22 1153     Visit Number 4    Number of Visits 8    Date for PT Re-Evaluation 07/20/22    Authorization Type [employer] ([employer])    PT Start Time 1147    PT Stop Time 1236    PT Time Calculation (min) 49 min    Activity Tolerance Patient tolerated treatment well    Behavior During Therapy WFL for tasks assessed/performed               Past Medical History:  Diagnosis Date   Allergy    GERD (gastroesophageal reflux disease) 12/26/2011   Heart murmur    echo in 98   Hypothyroidism    pos antibodies  goiter    THROMBOCYTOPENIA 02/01/2009   Qualifier: Diagnosis of  By: Fabian Sharp MD, Neta Mends    Past Surgical History:  Procedure Laterality Date   CERVICAL POLYPECTOMY     Patient Active Problem List   Diagnosis Date Noted   Screening breast examination 05/05/2019   Breast pain, right 05/05/2019   Plantar fasciitis of left foot 12/30/2013   Obesity (BMI 30-39.9) 12/30/2013   Visit for preventive health examination 12/30/2013   Hyperlipidemia 12/30/2013   Recurrent acute tonsillitis 03/13/2013   Mycotic toenails 12/26/2011   Allergic rhinitis 12/13/2008   Hypothyroidism 10/08/2007   NECK PAIN 10/08/2007   HEADACHE 10/08/2007    REFERRING DIAG:M54.50,G89.29 (ICD-10-CM) - Chronic right-sided low back pain without sciatica M54.6,G89.29 (ICD-10-CM) - Chronic bilateral thoracic back pain   THERAPY DIAG:  Other low back pain  Joint stiffness of spine  Muscle weakness (generalized)  Rationale for Evaluation and Treatment Rehabilitation  PERTINENT HISTORY: See above   PRECAUTIONS: none   SUBJECTIVE:I felt great Friday but the stabbing pain came back Saturday  .  Better now. Glutes were sore.    PAIN:  Are you having pain? Yes: NPRS scale: 3-4/10 Pain location: central mid low back  Pain description: sore  Aggravating factors: squatting , walking and standing incr pain  Relieving factors: nothing that I have found other than sitting    OBJECTIVE: (objective measures completed at initial evaluation unless otherwise dated)  DIAGNOSTIC FINDINGS:  Mild to moderate anterior disc space narrowing of the upper thoracic spine.     Mild-to-moderate L5-S1 and mild posterior L4-5 disc space narrowing.     PATIENT SURVEYS:  FOTO 56% (risk adj)    SCREENING FOR RED FLAGS: Bowel or bladder incontinence: No Spinal tumors: No Cauda equina syndrome: No Compression fracture: No Abdominal aneurysm: No   COGNITION:           Overall cognitive status: Within functional limits for tasks assessed                          SENSATION: WFL   MUSCLE LENGTH: Hamstrings: WFL Thomas test:WFL    POSTURE: decreased lumbar lordosis    PALPATION: TTP centrally L3-L4-L5, no soreness in glutes    LUMBAR ROM:    Active  A/PROM  eval  Flexion WNL  Extension 25%  Right lateral flexion 10  Left lateral flexion 10  Right rotation Min pain WNL   Left rotation Min pain WNL    (  Blank rows = not tested)   LOWER EXTREMITY ROM:    WNL throughout LEs  AROM  Right eval Left eval  Hip flexion      Hip extension      Hip abduction      Hip adduction      Hip internal rotation      Hip external rotation      Knee flexion      Knee extension      Ankle dorsiflexion      Ankle plantarflexion      Ankle inversion      Ankle eversion       (Blank rows = not tested)   LOWER EXTREMITY MMT:     MMT Right eval Left eval  Hip flexion 4+ 4+  Hip extension 3- 3-  Hip abduction 3- 3-  Hip adduction      Hip internal rotation      Hip external rotation      Knee flexion 5 5  Knee extension 5 5  Ankle dorsiflexion 5 5  Ankle plantarflexion      Ankle inversion      Ankle eversion        (Blank rows = not tested)   LUMBAR SPECIAL TESTS:  Straight leg raise test: Negative Neg Trendelenburg    FUNCTIONAL TESTS:  Squat : poor hip hinge, quad dominant   GAIT: Distance walked: 150 Assistive device utilized: None Level of assistance: I          Comments: no deviations noted        TODAY'S TREATMENT  PT eval Squat form check and cues HEP, core   [facility] Adult PT Treatment:                                                DATE: 06/18/22 Therapeutic Exercise: Recumbent bike L3 for 6 min Figure 4, 30 sec x 3   Supine core work , stability training A/P tilt over soft ball x 10 Clam unilateral,  knee extension and SLR  90/90 legs with ball isometric hold Toe taps x 10  Dead bug variations holding 6 lbs dumbbell x 10 each  Knees to chest  Articulating bridge with ball x 10  Sit to stand 15 lbs KB 2 sets x 10, better with knees wide toes sl. Out.   Manual Therapy: Prone PROM ER/IR with compression to glutes, piriformis Lt sided tightness  vs Rt.   [facility] Adult PT Treatment:                                                DATE: 06/14/22 Therapeutic Exercise: Elliptical 6 min , level  Quads stretch standing  x 1 each x 30  sec  Piriformis 3 x 30 sec ( knee to opp shoulder)  Cat/cow, child pose  Hamstring 1 x 30 sec supine , has seated  ITB 1 x 30 sec  Seated figure 4 , 1 x 30 sec  Hip abduction (this is an overlap) Hip extension 2.5 lbs cuff  x 45 sec then bent knee (donkey kick ) x 45 sec  Sidelying hip abduction cuff wgt x 15 added forward /back motion (kick) x 8  Pilates  Reformer:   Supine arms and abs 1 Red 1 Yellow Arcs, single leg stretch ,  upper ab curl Bridging 2 red 1 blue x 10  Footwork 2 red 1 blue 1 yellow Parallel, heels and then toes Pilates V and then Sumo on heels , all about 15 reps  Self Care: Discussed strength ex vs stretching and flexibility Dr. Edison Pace HEP taking too long in addition to PT.  And balancing her home workouts.      [facility]  Adult PT Treatment:                                                DATE: 06/12/22 Therapeutic Exercise: Bridge x 15 SLR x 15  Clam x 15  Hip hinging sit to stand progressed to squat with 10 lbs min pain in back with this  Wall squat with ball no pain in back  Wall sit 10 lbs with quad pushing  Dead lift double leg blue band  x 10, then with 10 lbs less pain x 10  Dead lift stagger stance Rt x 10 and Lt. X 10  Glider lunge on wall x 15  Step ups glute x 10 each  Hip abd x15 standing  Figure 4 for hip stretching   PATIENT EDUCATION:  Education details: HEP, POC, squat form , core            Person educated: Patient Education method: Explanation, Demonstration, Verbal cues, and Handouts Education comprehension: verbalized understanding and returned demonstration     HOME EXERCISE PROGRAM: Access Code: [email or web address] URL: [email or web address] Date: 06/08/2022 Prepared by: Karie Mainland  Access Code: [email or web address] URL: [email or web address] Date: 06/14/2022 Prepared by: Karie Mainland  Exercises - Sidelying Hip Abduction  - 2 x daily - 7 x weekly - 2 sets - 10 reps - 5 hold - Clamshell with Resistance  - 2 x daily - 7 x weekly - 2 sets - 10 reps - 5 hold - Quadruped on Forearms Hip Extension  - 2 x daily - 7 x weekly - 2 sets - 10 reps - 5 hold - Squat with Chair Touch  - 2 x daily - 7 x weekly - 2 sets - 10 reps - 5 hold - Standing Hip Abduction with Counter Support  - 1 x daily - 7 x weekly - 2 sets - 10 reps - 30 hold - Reverse Lunge on Slider  - 1 x daily - 7 x weekly - 2 sets - 10 reps - Standing Quadriceps Stretch  - 1 x daily - 7 x weekly - 1 sets - 3 reps - 30 hold - Supine Hamstring Stretch with Strap  - 1 x daily - 7 x weekly - 1 sets - 3 reps - 30 hold - Supine ITB Stretch with Strap  - 1 x daily - 7 x weekly - 1 sets - 3 reps - 30 hold - Supine Piriformis Stretch with Leg Straight  - 1 x daily - 7 x weekly - 1 sets - 3 reps - 30 hold  ASSESSMENT:    CLINICAL IMPRESSION: Patient worked on core stability today without increasing back pain   With squats and sit to stand she has less back discomfort with a wider stance.  She may be interested in dry needling to Rt glutes, paraspinals in the coming visits.    OBJECTIVE IMPAIRMENTS decreased  mobility, difficulty walking, decreased ROM, decreased strength, hypomobility, increased fascial restrictions, postural dysfunction, and pain.    ACTIVITY LIMITATIONS standing, squatting, and locomotion level   PARTICIPATION LIMITATIONS: shopping, community activity, occupation, and fitness   PERSONAL FACTORS Time since onset of injury/illness/exacerbation are also affecting patient's functional outcome.    REHAB POTENTIAL: Excellent   CLINICAL DECISION MAKING: Stable/uncomplicated   EVALUATION COMPLEXITY: Low     GOALS:     LONG TERM GOALS: Target date: 07/20/2022   Pt will be I with HEP for hips, core  Baseline:  Goal status: ongoing    2.  Pt will be able to show 4/5 or better strength in hips  Baseline: 3-/5 to 3 Goal status: INITIAL   3.  Pt will improve FOTO core to 65% or better to show functional improvement Baseline:  Goal status: INITIAL   4.  Pt will be able to squat with 20-25 lbs and no increase in back pain .  Baseline:  Goal status: INITIAL   5.  Pt will be able to walk for 30 min with min increase in back pain < 3/10  Baseline:  Goal status: INITIAL     PLAN: PT FREQUENCY: 2x/week   PT DURATION: 6 weeks   PLANNED INTERVENTIONS: Therapeutic exercises, Therapeutic activity, Neuromuscular re-education, Balance training, Patient/Family education, Self Care, Joint mobilization, Spinal mobilization, Cryotherapy, Moist heat, Manual therapy, and Re-evaluation.   PLAN FOR NEXT SESSION: can she get DN ? check HEP , body weight squat , glute strength.  Progress lifts. Pilates?       Raeford Razor, PT 06/19/22 12:48 PM Phone: [phone] Fax: [phone]

## 2022-06-19 ENCOUNTER — Ambulatory Visit: Payer: No Typology Code available for payment source | Admitting: Physical Therapy

## 2022-06-19 ENCOUNTER — Encounter: Payer: Self-pay | Admitting: Physical Therapy

## 2022-06-19 DIAGNOSIS — M2560 Stiffness of unspecified joint, not elsewhere classified: Secondary | ICD-10-CM

## 2022-06-19 DIAGNOSIS — M5459 Other low back pain: Secondary | ICD-10-CM | POA: Diagnosis not present

## 2022-06-19 DIAGNOSIS — M6281 Muscle weakness (generalized): Secondary | ICD-10-CM

## 2022-06-21 ENCOUNTER — Encounter: Payer: Self-pay | Admitting: Physical Therapy

## 2022-06-21 ENCOUNTER — Ambulatory Visit: Payer: No Typology Code available for payment source | Admitting: Physical Therapy

## 2022-06-21 DIAGNOSIS — M6281 Muscle weakness (generalized): Secondary | ICD-10-CM

## 2022-06-21 DIAGNOSIS — M2560 Stiffness of unspecified joint, not elsewhere classified: Secondary | ICD-10-CM

## 2022-06-21 DIAGNOSIS — M5459 Other low back pain: Secondary | ICD-10-CM

## 2022-06-21 NOTE — Therapy (Signed)
OUTPATIENT PHYSICAL THERAPY TREATMENT NOTE   Patient Name: Lisa Bradford MRN: [id number] DOB:1971/08/22, 51 y.o., female Today's Date: 06/21/2022  PCP: Richardean Sale, DO   REFERRING PROVIDER: Madelin Headings   END OF SESSION:   PT End of Session - 06/21/22 1016     Visit Number 5    Number of Visits 8    Date for PT Re-Evaluation 07/20/22    Authorization Type [employer] ([employer])    PT Start Time 1015    PT Stop Time 1100    PT Time Calculation (min) 45 min               Past Medical History:  Diagnosis Date   Allergy    GERD (gastroesophageal reflux disease) 12/26/2011   Heart murmur    echo in 98   Hypothyroidism    pos antibodies  goiter    THROMBOCYTOPENIA 02/01/2009   Qualifier: Diagnosis of  By: Fabian Sharp MD, Neta Mends    Past Surgical History:  Procedure Laterality Date   CERVICAL POLYPECTOMY     Patient Active Problem List   Diagnosis Date Noted   Screening breast examination 05/05/2019   Breast pain, right 05/05/2019   Plantar fasciitis of left foot 12/30/2013   Obesity (BMI 30-39.9) 12/30/2013   Visit for preventive health examination 12/30/2013   Hyperlipidemia 12/30/2013   Recurrent acute tonsillitis 03/13/2013   Mycotic toenails 12/26/2011   Allergic rhinitis 12/13/2008   Hypothyroidism 10/08/2007   NECK PAIN 10/08/2007   HEADACHE 10/08/2007    REFERRING DIAG:M54.50,G89.29 (ICD-10-CM) - Chronic right-sided low back pain without sciatica M54.6,G89.29 (ICD-10-CM) - Chronic bilateral thoracic back pain   THERAPY DIAG:  Other low back pain  Joint stiffness of spine  Muscle weakness (generalized)  Rationale for Evaluation and Treatment Rehabilitation  PERTINENT HISTORY: See above   PRECAUTIONS: none   SUBJECTIVE: My back is more sore today. I felt great Friday but the stabbing pain came back Saturday  .  Better now. Glutes were sore.   PAIN:  Are you having pain? Yes: NPRS scale: 5/10 Pain location: central mid low back   Pain description: sore  Aggravating factors: squatting , walking and standing incr pain  Relieving factors: nothing that I have found other than sitting    OBJECTIVE: (objective measures completed at initial evaluation unless otherwise dated)  DIAGNOSTIC FINDINGS:  Mild to moderate anterior disc space narrowing of the upper thoracic spine.     Mild-to-moderate L5-S1 and mild posterior L4-5 disc space narrowing.     PATIENT SURVEYS:  FOTO 56% (risk adj)    SCREENING FOR RED FLAGS: Bowel or bladder incontinence: No Spinal tumors: No Cauda equina syndrome: No Compression fracture: No Abdominal aneurysm: No   COGNITION:           Overall cognitive status: Within functional limits for tasks assessed                          SENSATION: WFL   MUSCLE LENGTH: Hamstrings: WFL Thomas test:WFL    POSTURE: decreased lumbar lordosis    PALPATION: TTP centrally L3-L4-L5, no soreness in glutes    LUMBAR ROM:    Active  A/PROM  eval  Flexion WNL  Extension 25%  Right lateral flexion 10  Left lateral flexion 10  Right rotation Min pain WNL   Left rotation Min pain WNL    (Blank rows = not tested)   LOWER EXTREMITY ROM:  WNL throughout LEs  AROM  Right eval Left eval  Hip flexion      Hip extension      Hip abduction      Hip adduction      Hip internal rotation      Hip external rotation      Knee flexion      Knee extension      Ankle dorsiflexion      Ankle plantarflexion      Ankle inversion      Ankle eversion       (Blank rows = not tested)   LOWER EXTREMITY MMT:     MMT Right eval Left eval  Hip flexion 4+ 4+  Hip extension 3- 3-  Hip abduction 3- 3-  Hip adduction      Hip internal rotation      Hip external rotation      Knee flexion 5 5  Knee extension 5 5  Ankle dorsiflexion 5 5  Ankle plantarflexion      Ankle inversion      Ankle eversion       (Blank rows = not tested)   LUMBAR SPECIAL TESTS:  Straight leg raise test:  Negative Neg Trendelenburg    FUNCTIONAL TESTS:  Squat : poor hip hinge, quad dominant   GAIT: Distance walked: 150 Assistive device utilized: None Level of assistance: I          Comments: no deviations noted        TODAY'S TREATMENT  PT eval Squat form check and cues HEP, core   [facility] Adult PT Treatment:                                                DATE: 06/21/22 Therapeutic Exercise: Recumbent bike L3 for 6 min Sit to stand 15 lbs KB 2 sets x 10 8 inch step ups 2 x 15 each - cues for control Childs pose with laterals  Table top heel taps Dead bug with LE extensions S/L hip abduction x 15 each  S/L clam x 15 each,  red band  Bridge with red band clam x 15  Single leg bridge x 10 each - more difficult on right  Figure 4 push and pull , 30 sec x 3   Knees to chest  Standing hip abduction - corrected HEP 30 sec hold to 5 sec hold     [facility] Adult PT Treatment:                                                DATE: 06/18/22 Therapeutic Exercise: Recumbent bike L3 for 6 min Figure 4, 30 sec x 3   Supine core work , stability training A/P tilt over soft ball x 10 Clam unilateral,  knee extension and SLR  90/90 legs with ball isometric hold Toe taps x 10  Dead bug variations holding 6 lbs dumbbell x 10 each  Knees to chest  Articulating bridge with ball x 10  Sit to stand 15 lbs KB 2 sets x 10, better with knees wide toes sl. Out.   Manual Therapy: Prone PROM ER/IR with compression to glutes, piriformis Lt sided tightness  vs Rt.   [facility]  Adult PT Treatment:                                                DATE: 06/14/22 Therapeutic Exercise: Elliptical 6 min , level  Quads stretch standing  x 1 each x 30  sec  Piriformis 3 x 30 sec ( knee to opp shoulder)  Cat/cow, child pose  Hamstring 1 x 30 sec supine , has seated  ITB 1 x 30 sec  Seated figure 4 , 1 x 30 sec  Hip abduction (this is an overlap) Hip extension 2.5 lbs cuff  x 45 sec then bent knee (donkey kick ) x  45 sec  Sidelying hip abduction cuff wgt x 15 added forward /back motion (kick) x 8  Pilates Reformer:   Supine arms and abs 1 Red 1 Yellow Arcs, single leg stretch ,  upper ab curl Bridging 2 red 1 blue x 10  Footwork 2 red 1 blue 1 yellow Parallel, heels and then toes Pilates V and then Sumo on heels , all about 15 reps  Self Care: Discussed strength ex vs stretching and flexibility Dr. Edison Pace HEP taking too long in addition to PT.  And balancing her home workouts.      [facility] Adult PT Treatment:                                                DATE: 06/12/22 Therapeutic Exercise: Bridge x 15 SLR x 15  Clam x 15  Hip hinging sit to stand progressed to squat with 10 lbs min pain in back with this  Wall squat with ball no pain in back  Wall sit 10 lbs with quad pushing  Dead lift double leg blue band  x 10, then with 10 lbs less pain x 10  Dead lift stagger stance Rt x 10 and Lt. X 10  Glider lunge on wall x 15  Step ups glute x 10 each  Hip abd x15 standing  Figure 4 for hip stretching   PATIENT EDUCATION:  Education details: HEP, POC, squat form , core            Person educated: Patient Education method: Explanation, Demonstration, Verbal cues, and Handouts Education comprehension: verbalized understanding and returned demonstration     HOME EXERCISE PROGRAM: Access Code: [email or web address] URL: [email or web address] Date: 06/08/2022 Prepared by: Karie Mainland  Access Code: [email or web address] URL: [email or web address] Date: 06/14/2022 Prepared by: Karie Mainland  Exercises - Sidelying Hip Abduction  - 2 x daily - 7 x weekly - 2 sets - 10 reps - 5 hold - Clamshell with Resistance  - 2 x daily - 7 x weekly - 2 sets - 10 reps - 5 hold - Quadruped on Forearms Hip Extension  - 2 x daily - 7 x weekly - 2 sets - 10 reps - 5 hold - Squat with Chair Touch  - 2 x daily - 7 x weekly - 2 sets - 10 reps - 5 hold - Standing Hip Abduction with Counter Support  - 1 x daily - 7  x weekly - 2 sets - 10 reps - 30 hold - Reverse Lunge on Slider  - 1 x daily - 7 x weekly - 2 sets -  10 reps - Standing Quadriceps Stretch  - 1 x daily - 7 x weekly - 1 sets - 3 reps - 30 hold - Supine Hamstring Stretch with Strap  - 1 x daily - 7 x weekly - 1 sets - 3 reps - 30 hold - Supine ITB Stretch with Strap  - 1 x daily - 7 x weekly - 1 sets - 3 reps - 30 hold - Supine Piriformis Stretch with Leg Straight  - 1 x daily - 7 x weekly - 1 sets - 3 reps - 30 hold  ASSESSMENT:   CLINICAL IMPRESSION: Patient worked on core stability today without increasing back pain. She arrives with 5/10 soreness today and attributes it to over doing her hip abduction exercises in standing which causes a pinching in her lower gluteals. Reviewed standing hip strengthening and she was holding each rep for 30 seconds. This was corrected. Began step ups with good tolerance and cues for control. No increased pain with weighted STS today.   She is interested in dry needling to Rt glutes, paraspinals in the coming visits.    OBJECTIVE IMPAIRMENTS decreased mobility, difficulty walking, decreased ROM, decreased strength, hypomobility, increased fascial restrictions, postural dysfunction, and pain.    ACTIVITY LIMITATIONS standing, squatting, and locomotion level   PARTICIPATION LIMITATIONS: shopping, community activity, occupation, and fitness   PERSONAL FACTORS Time since onset of injury/illness/exacerbation are also affecting patient's functional outcome.    REHAB POTENTIAL: Excellent   CLINICAL DECISION MAKING: Stable/uncomplicated   EVALUATION COMPLEXITY: Low     GOALS:     LONG TERM GOALS: Target date: 07/20/2022   Pt will be I with HEP for hips, core  Baseline:  Goal status: ongoing    2.  Pt will be able to show 4/5 or better strength in hips  Baseline: 3-/5 to 3 Goal status: INITIAL   3.  Pt will improve FOTO core to 65% or better to show functional improvement Baseline:  Goal status:  INITIAL   4.  Pt will be able to squat with 20-25 lbs and no increase in back pain .  Baseline:  Goal status: INITIAL   5.  Pt will be able to walk for 30 min with min increase in back pain < 3/10  Baseline:  Goal status: INITIAL     PLAN: PT FREQUENCY: 2x/week   PT DURATION: 6 weeks   PLANNED INTERVENTIONS: Therapeutic exercises, Therapeutic activity, Neuromuscular re-education, Balance training, Patient/Family education, Self Care, Joint mobilization, Spinal mobilization, Cryotherapy, Moist heat, Manual therapy, and Re-evaluation.   PLAN FOR NEXT SESSION: can she get DN ? check HEP , body weight squat , glute strength.  Progress lifts. Pilates?       Raeford Razor, PT 06/21/22 11:10 AM Phone: [phone] Fax: [phone]

## 2022-06-25 NOTE — Therapy (Signed)
OUTPATIENT PHYSICAL THERAPY TREATMENT NOTE   Patient Name: Lisa Bradford MRN: [id number] DOB:08-16-1971, 51 y.o., female Today's Date: 06/26/2022  PCP: Glennon Mac, DO   REFERRING PROVIDER: Burnis Medin   END OF SESSION:   PT End of Session - 06/26/22 1146     Visit Number 6    Number of Visits 8    Date for PT Re-Evaluation 07/20/22    Authorization Type [employer] ([place] share)    PT Start Time 1145    PT Stop Time 1235    PT Time Calculation (min) 50 min    Activity Tolerance Patient tolerated treatment well    Behavior During Therapy WFL for tasks assessed/performed                Past Medical History:  Diagnosis Date   Allergy    GERD (gastroesophageal reflux disease) 12/26/2011   Heart murmur    echo in 98   Hypothyroidism    pos antibodies  goiter    THROMBOCYTOPENIA 02/01/2009   Qualifier: Diagnosis of  By: Regis Bill MD, Standley Brooking    Past Surgical History:  Procedure Laterality Date   CERVICAL POLYPECTOMY     Patient Active Problem List   Diagnosis Date Noted   Screening breast examination 05/05/2019   Breast pain, right 05/05/2019   Plantar fasciitis of left foot 12/30/2013   Obesity (BMI 30-39.9) 12/30/2013   Visit for preventive health examination 12/30/2013   Hyperlipidemia 12/30/2013   Recurrent acute tonsillitis 03/13/2013   Mycotic toenails 12/26/2011   Allergic rhinitis 12/13/2008   Hypothyroidism 10/08/2007   NECK PAIN 10/08/2007   HEADACHE 10/08/2007    REFERRING DIAG:M54.50,G89.29 (ICD-10-CM) - Chronic right-sided low back pain without sciatica M54.6,G89.29 (ICD-10-CM) - Chronic bilateral thoracic back pain   THERAPY DIAG:  Other low back pain  Joint stiffness of spine  Muscle weakness (generalized)  Rationale for Evaluation and Treatment Rehabilitation  PERTINENT HISTORY: See above   PRECAUTIONS: none   SUBJECTIVE: Patient reports she was feeling better, but then she stood for 10 minutes and it started hurting  more. "It hurts to stand." She is interested in [facility].   PAIN:  Are you having pain? Yes: NPRS scale: 4/10 Pain location: central low back  Pain description: stabbing  Aggravating factors: squatting , walking and standing incr pain  Relieving factors: nothing that I have found other than sitting    OBJECTIVE: (objective measures completed at initial evaluation unless otherwise dated)  DIAGNOSTIC FINDINGS:  Mild to moderate anterior disc space narrowing of the upper thoracic spine.     Mild-to-moderate L5-S1 and mild posterior L4-5 disc space narrowing.     PATIENT SURVEYS:  FOTO 56% (risk adj)    SCREENING FOR RED FLAGS: Bowel or bladder incontinence: No Spinal tumors: No Cauda equina syndrome: No Compression fracture: No Abdominal aneurysm: No   COGNITION:           Overall cognitive status: Within functional limits for tasks assessed                          SENSATION: WFL   MUSCLE LENGTH: Hamstrings: WFL Thomas test:WFL    POSTURE: decreased lumbar lordosis    PALPATION: TTP centrally L3-L4-L5, no soreness in glutes    LUMBAR ROM:    Active  A/PROM  eval  Flexion WNL  Extension 25%  Right lateral flexion 10  Left lateral flexion 10  Right rotation Min pain WNL  Left rotation Min pain WNL    (Blank rows = not tested)   LOWER EXTREMITY ROM:    WNL throughout LEs  AROM  Right eval Left eval  Hip flexion      Hip extension      Hip abduction      Hip adduction      Hip internal rotation      Hip external rotation      Knee flexion      Knee extension      Ankle dorsiflexion      Ankle plantarflexion      Ankle inversion      Ankle eversion       (Blank rows = not tested)   LOWER EXTREMITY MMT:     MMT Right eval Left eval  Hip flexion 4+ 4+  Hip extension 3- 3-  Hip abduction 3- 3-  Hip adduction      Hip internal rotation      Hip external rotation      Knee flexion 5 5  Knee extension 5 5  Ankle dorsiflexion 5 5  Ankle  plantarflexion      Ankle inversion      Ankle eversion       (Blank rows = not tested)   LUMBAR SPECIAL TESTS:  Straight leg raise test: Negative Neg Trendelenburg    FUNCTIONAL TESTS:  Squat : poor hip hinge, quad dominant   GAIT: Distance walked: 150 Assistive device utilized: None Level of assistance: I          Comments: no deviations noted       [facility] Adult PT Treatment:                                                DATE: 06/26/22 Therapeutic Exercise: Supine pelvic tilts 2 x 10  Seated hip hinge with dowel x 10  Standing hip hinge with dowel x 10  Deadlift with 5 lbs 2 x 10 Deadlift with 10 lbs attempted increased pain. Resisted deadlift with green band x 10 Updated HEP  Manual Therapy: STM bilateral lumbar paraspinals CPAs grade II L-spine Trigger Point Dry Needling Treatment: Pre-treatment instruction: Patient instructed on dry needling rationale, procedures, and possible side effects including pain during treatment (achy,cramping feeling), bruising, drop of blood, lightheadedness, nausea, sweating. Patient Consent Given: Yes Education handout provided: Yes Muscles treated: Rt lumbar paraspinals; Rt L4 Multifidi   Needle size and number: .30x44mm x 2 Electrical stimulation performed: No Parameters: N/A Treatment response/outcome: Twitch response elicited and Palpable decrease in muscle tension Post-treatment instructions: Patient instructed to expect possible mild to moderate muscle soreness later today and/or tomorrow. Patient instructed in methods to reduce muscle soreness and to continue prescribed HEP. If patient was dry needled over the lung field, patient was instructed on signs and symptoms of pneumothorax and, however unlikely, to see immediate medical attention should they occur. Patient was also educated on signs and symptoms of infection and to seek medical attention should they occur. Patient verbalized understanding of these instructions and  education.   [facility] Adult PT Treatment:  DATE: 06/21/22 Therapeutic Exercise: Recumbent bike L3 for 6 min Sit to stand 15 lbs KB 2 sets x 10 8 inch step ups 2 x 15 each - cues for control Childs pose with laterals  Table top heel taps Dead bug with LE extensions S/L hip abduction x 15 each  S/L clam x 15 each,  red band  Bridge with red band clam x 15  Single leg bridge x 10 each - more difficult on right  Figure 4 push and pull , 30 sec x 3   Knees to chest  Standing hip abduction - corrected HEP 30 sec hold to 5 sec hold     [facility] Adult PT Treatment:                                                DATE: 06/18/22 Therapeutic Exercise: Recumbent bike L3 for 6 min Figure 4, 30 sec x 3   Supine core work , stability training A/P tilt over soft ball x 10 Clam unilateral,  knee extension and SLR  90/90 legs with ball isometric hold Toe taps x 10  Dead bug variations holding 6 lbs dumbbell x 10 each  Knees to chest  Articulating bridge with ball x 10  Sit to stand 15 lbs KB 2 sets x 10, better with knees wide toes sl. Out.   Manual Therapy: Prone PROM ER/IR with compression to glutes, piriformis Lt sided tightness  vs Rt.    PATIENT EDUCATION:  Education details: HEP,          Person educated: Patient Education method: Education officer, environmental, Verbal cues, and Handouts Education comprehension: verbalized understanding and returned demonstration     HOME EXERCISE PROGRAM: Access Code: [email or web address] URL: [email or web address] Date: 06/08/2022 Prepared by: Raeford Razor  Access Code: [email or web address] URL: [email or web address] Date: 06/14/2022 Prepared by: Raeford Razor  Exercises - Sidelying Hip Abduction  - 2 x daily - 7 x weekly - 2 sets - 10 reps - 5 hold - Clamshell with Resistance  - 2 x daily - 7 x weekly - 2 sets - 10 reps - 5 hold - Quadruped on Forearms Hip Extension  - 2 x daily - 7 x weekly - 2  sets - 10 reps - 5 hold - Squat with Chair Touch  - 2 x daily - 7 x weekly - 2 sets - 10 reps - 5 hold - Standing Hip Abduction with Counter Support  - 1 x daily - 7 x weekly - 2 sets - 10 reps - 30 hold - Reverse Lunge on Slider  - 1 x daily - 7 x weekly - 2 sets - 10 reps - Standing Quadriceps Stretch  - 1 x daily - 7 x weekly - 1 sets - 3 reps - 30 hold - Supine Hamstring Stretch with Strap  - 1 x daily - 7 x weekly - 1 sets - 3 reps - 30 hold - Supine ITB Stretch with Strap  - 1 x daily - 7 x weekly - 1 sets - 3 reps - 30 hold - Supine Piriformis Stretch with Leg Straight  - 1 x daily - 7 x weekly - 1 sets - 3 reps - 30 hold  ASSESSMENT:   CLINICAL IMPRESSION:  Patient was noted to have tautness and palpable tenderness about Rt lumbar paraspinals and pain/hypomobility about the lumbar spine most  notable at L3-L5. TPDN was performed to the Rt lumbar paraspinals and Rt L4 multifidi with twitch response elicited and reduction in tautness noted about paraspinals. Focused on progression of hip hinge/dead lifting with patient requiring initial cues to decrease trunk flexion. She tolerated light weight dead lifting well reporting pain during the first few reps that eased off with continued reps. She was unable to tolerate dead lifting with 10 lbs today secondary to an increase in pain. No change in her pain reported at conclusion of today's session.    OBJECTIVE IMPAIRMENTS decreased mobility, difficulty walking, decreased ROM, decreased strength, hypomobility, increased fascial restrictions, postural dysfunction, and pain.    ACTIVITY LIMITATIONS standing, squatting, and locomotion level   PARTICIPATION LIMITATIONS: shopping, community activity, occupation, and fitness   PERSONAL FACTORS Time since onset of injury/illness/exacerbation are also affecting patient's functional outcome.    REHAB POTENTIAL: Excellent   CLINICAL DECISION MAKING: Stable/uncomplicated   EVALUATION COMPLEXITY: Low      GOALS:     LONG TERM GOALS: Target date: 07/20/2022   Pt will be I with HEP for hips, core  Baseline:  Goal status: ongoing    2.  Pt will be able to show 4/5 or better strength in hips  Baseline: 3-/5 to 3 Goal status: INITIAL   3.  Pt will improve FOTO core to 65% or better to show functional improvement Baseline:  Goal status: INITIAL   4.  Pt will be able to squat with 20-25 lbs and no increase in back pain .  Baseline:  Goal status: INITIAL   5.  Pt will be able to walk for 30 min with min increase in back pain < 3/10  Baseline:  Goal status: INITIAL     PLAN: PT FREQUENCY: 2x/week   PT DURATION: 6 weeks   PLANNED INTERVENTIONS: Therapeutic exercises, Therapeutic activity, Neuromuscular re-education, Balance training, Patient/Family education, Self Care, Joint mobilization, Spinal mobilization, Cryotherapy, Moist heat, Manual therapy, and Re-evaluation.   PLAN FOR NEXT SESSION:  check HEP , body weight squat , glute strength.  Progress lifts. Pilates?; assess response to TPDN potential need for further TPDN to multifidi pending response; lumbar manip?    Gwendolyn Grant, PT, DPT, ATC 06/26/22 12:41 PM

## 2022-06-26 ENCOUNTER — Ambulatory Visit: Payer: PRIVATE HEALTH INSURANCE | Attending: Sports Medicine

## 2022-06-26 DIAGNOSIS — M2560 Stiffness of unspecified joint, not elsewhere classified: Secondary | ICD-10-CM | POA: Insufficient documentation

## 2022-06-26 DIAGNOSIS — M6281 Muscle weakness (generalized): Secondary | ICD-10-CM | POA: Insufficient documentation

## 2022-06-26 DIAGNOSIS — M5459 Other low back pain: Secondary | ICD-10-CM | POA: Insufficient documentation

## 2022-06-26 NOTE — Patient Instructions (Signed)

## 2022-06-28 ENCOUNTER — Encounter: Payer: Self-pay | Admitting: Physical Therapy

## 2022-06-28 ENCOUNTER — Ambulatory Visit: Payer: No Typology Code available for payment source | Admitting: Sports Medicine

## 2022-06-28 ENCOUNTER — Ambulatory Visit: Payer: PRIVATE HEALTH INSURANCE | Admitting: Physical Therapy

## 2022-06-28 DIAGNOSIS — M2560 Stiffness of unspecified joint, not elsewhere classified: Secondary | ICD-10-CM

## 2022-06-28 DIAGNOSIS — M5459 Other low back pain: Secondary | ICD-10-CM | POA: Diagnosis not present

## 2022-06-28 DIAGNOSIS — M6281 Muscle weakness (generalized): Secondary | ICD-10-CM

## 2022-06-28 NOTE — Therapy (Signed)
OUTPATIENT PHYSICAL THERAPY TREATMENT NOTE   Patient Name: Lisa Bradford MRN: [id number] DOB:August 05, 1971, 51 y.o., female Today's Date: 06/28/2022  PCP: Glennon Mac, DO   REFERRING PROVIDER: Burnis Medin   END OF SESSION:   PT End of Session - 06/28/22 1018     Visit Number 7    Number of Visits 8    Date for PT Re-Evaluation 07/20/22    Authorization Type [employer] ([place] share)    PT Start Time 1016    PT Stop Time 1100    PT Time Calculation (min) 44 min    Activity Tolerance Patient tolerated treatment well    Behavior During Therapy WFL for tasks assessed/performed                Past Medical History:  Diagnosis Date   Allergy    GERD (gastroesophageal reflux disease) 12/26/2011   Heart murmur    echo in 98   Hypothyroidism    pos antibodies  goiter    THROMBOCYTOPENIA 02/01/2009   Qualifier: Diagnosis of  By: Regis Bill MD, Standley Brooking    Past Surgical History:  Procedure Laterality Date   CERVICAL POLYPECTOMY     Patient Active Problem List   Diagnosis Date Noted   Screening breast examination 05/05/2019   Breast pain, right 05/05/2019   Plantar fasciitis of left foot 12/30/2013   Obesity (BMI 30-39.9) 12/30/2013   Visit for preventive health examination 12/30/2013   Hyperlipidemia 12/30/2013   Recurrent acute tonsillitis 03/13/2013   Mycotic toenails 12/26/2011   Allergic rhinitis 12/13/2008   Hypothyroidism 10/08/2007   NECK PAIN 10/08/2007   HEADACHE 10/08/2007    REFERRING DIAG:M54.50,G89.29 (ICD-10-CM) - Chronic right-sided low back pain without sciatica M54.6,G89.29 (ICD-10-CM) - Chronic bilateral thoracic back pain   THERAPY DIAG:  Other low back pain  Joint stiffness of spine  Muscle weakness (generalized)  Rationale for Evaluation and Treatment Rehabilitation  PERTINENT HISTORY: See above   PRECAUTIONS: none   SUBJECTIVE:  She had no adverse reactions to the 1 dry needling treatment.  No change in symptoms either.   She is somewhat frustrated with her lack of progress.  She sees Dr. Glennon Mac 10/18. She wants to finish her appts before she sees him .   PAIN:  Are you having pain? Yes: NPRS scale: 4/10 Pain location: central low back  Pain description: stabbing  Aggravating factors: squatting , walking and standing incr pain  Relieving factors: nothing that I have found other than sitting    OBJECTIVE: (objective measures completed at initial evaluation unless otherwise dated)  DIAGNOSTIC FINDINGS:  Mild to moderate anterior disc space narrowing of the upper thoracic spine.     Mild-to-moderate L5-S1 and mild posterior L4-5 disc space narrowing.     PATIENT SURVEYS:  FOTO 56% (risk adj)    SCREENING FOR RED FLAGS: Bowel or bladder incontinence: No Spinal tumors: No Cauda equina syndrome: No Compression fracture: No Abdominal aneurysm: No   COGNITION:           Overall cognitive status: Within functional limits for tasks assessed                          SENSATION: WFL   MUSCLE LENGTH: Hamstrings: WFL Thomas test:WFL    POSTURE: decreased lumbar lordosis    PALPATION: TTP centrally L3-L4-L5, no soreness in glutes    LUMBAR ROM:    Active  A/PROM  eval  Flexion WNL  Extension  25%  Right lateral flexion 10  Left lateral flexion 10  Right rotation Min pain WNL   Left rotation Min pain WNL    (Blank rows = not tested)   LOWER EXTREMITY ROM:    WNL throughout LEs  AROM  Right eval Left eval  Hip flexion      Hip extension      Hip abduction      Hip adduction      Hip internal rotation      Hip external rotation      Knee flexion      Knee extension      Ankle dorsiflexion      Ankle plantarflexion      Ankle inversion      Ankle eversion       (Blank rows = not tested)   LOWER EXTREMITY MMT:     MMT Right eval Left eval Rt Lt.   Hip flexion 4+ 4+ 5 5  Hip extension 3- 3- 4 4  Hip abduction 3- 3- 4 4+  Hip adduction        Hip internal rotation         Hip external rotation        Knee flexion 5 5 5 5   Knee extension 5 5 5 5   Ankle dorsiflexion 5 5 5 5   Ankle plantarflexion        Ankle inversion        Ankle eversion         (Blank rows = not tested)    LUMBAR SPECIAL TESTS:  Straight leg raise test: Negative Neg Trendelenburg    FUNCTIONAL TESTS:  Squat : poor hip hinge, quad dominant   GAIT: Distance walked: 150 Assistive device utilized: None Level of assistance: I          Comments: no deviations noted       [facility] Adult PT Treatment:                                                DATE: 06/28/22 Therapeutic Exercise: Bridge with band articulation  Banded march in bridge Banded bridge with 10 lbs  Hip flexor 1/2 kneeling  Dead lift on knees with and without 10 lbs dumbbells (less pain with wgts) Tall kneeling "matrix" x 10 with 10 lbs  Bird dog with arm and hip extension x 10 each side  Manual Therapy: Prone manual to L3-L4-L5, rotational mobs Gr II, decompression to Sacrum Sidelying caudal pressure to Rt pelvic crest for elongation  Self Care: Progress, goals, FOTO    [facility] Adult PT Treatment:                                                DATE: 06/26/22 Therapeutic Exercise: Supine pelvic tilts 2 x 10  Seated hip hinge with dowel x 10  Standing hip hinge with dowel x 10  Deadlift with 5 lbs 2 x 10 Deadlift with 10 lbs attempted increased pain. Resisted deadlift with green band x 10 Updated HEP  Manual Therapy: STM bilateral lumbar paraspinals CPAs grade II L-spine Trigger Point Dry Needling Treatment: Pre-treatment instruction: Patient instructed on dry needling rationale, procedures, and possible side effects including pain during  treatment (achy,cramping feeling), bruising, drop of blood, lightheadedness, nausea, sweating. Patient Consent Given: Yes Education handout provided: Yes Muscles treated: Rt lumbar paraspinals; Rt L4 Multifidi   Needle size and number: .30x82mm x 2 Electrical stimulation  performed: No Parameters: N/A Treatment response/outcome: Twitch response elicited and Palpable decrease in muscle tension Post-treatment instructions: Patient instructed to expect possible mild to moderate muscle soreness later today and/or tomorrow. Patient instructed in methods to reduce muscle soreness and to continue prescribed HEP. If patient was dry needled over the lung field, patient was instructed on signs and symptoms of pneumothorax and, however unlikely, to see immediate medical attention should they occur. Patient was also educated on signs and symptoms of infection and to seek medical attention should they occur. Patient verbalized understanding of these instructions and education.   [facility] Adult PT Treatment:                                                DATE: 06/21/22 Therapeutic Exercise: Recumbent bike L3 for 6 min Sit to stand 15 lbs KB 2 sets x 10 8 inch step ups 2 x 15 each - cues for control Childs pose with laterals  Table top heel taps Dead bug with LE extensions S/L hip abduction x 15 each  S/L clam x 15 each,  red band  Bridge with red band clam x 15  Single leg bridge x 10 each - more difficult on right  Figure 4 push and pull , 30 sec x 3   Knees to chest  Standing hip abduction - corrected HEP 30 sec hold to 5 sec hold     [facility] Adult PT Treatment:                                                DATE: 06/18/22 Therapeutic Exercise: Recumbent bike L3 for 6 min Figure 4, 30 sec x 3   Supine core work , stability training A/P tilt over soft ball x 10 Clam unilateral,  knee extension and SLR  90/90 legs with ball isometric hold Toe taps x 10  Dead bug variations holding 6 lbs dumbbell x 10 each  Knees to chest  Articulating bridge with ball x 10  Sit to stand 15 lbs KB 2 sets x 10, better with knees wide toes sl. Out.   Manual Therapy: Prone PROM ER/IR with compression to glutes, piriformis Lt sided tightness  vs Rt.    PATIENT EDUCATION:  Education  details: HEP,          Person educated: Patient Education method: Education officer, environmental, Verbal cues, and Handouts Education comprehension: verbalized understanding and returned demonstration     HOME EXERCISE PROGRAM: Access Code: [email or web address] URL: [email or web address] Date: 06/08/2022 Prepared by: Raeford Razor  Access Code: [email or web address] URL: [email or web address] Date: 06/14/2022 Prepared by: Raeford Razor  Exercises - Sidelying Hip Abduction  - 2 x daily - 7 x weekly - 2 sets - 10 reps - 5 hold - Clamshell with Resistance  - 2 x daily - 7 x weekly - 2 sets - 10 reps - 5 hold - Quadruped on Forearms Hip Extension  - 2 x daily - 7 x weekly - 2 sets - 10 reps - 5 hold -  Squat with Chair Touch  - 2 x daily - 7 x weekly - 2 sets - 10 reps - 5 hold - Standing Hip Abduction with Counter Support  - 1 x daily - 7 x weekly - 2 sets - 10 reps - 30 hold - Reverse Lunge on Slider  - 1 x daily - 7 x weekly - 2 sets - 10 reps - Standing Quadriceps Stretch  - 1 x daily - 7 x weekly - 1 sets - 3 reps - 30 hold - Supine Hamstring Stretch with Strap  - 1 x daily - 7 x weekly - 1 sets - 3 reps - 30 hold - Supine ITB Stretch with Strap  - 1 x daily - 7 x weekly - 1 sets - 3 reps - 30 hold - Supine Piriformis Stretch with Leg Straight  - 1 x daily - 7 x weekly - 1 sets - 3 reps - 30 hold  ASSESSMENT:   CLINICAL IMPRESSION: Patient improves about 6% on FOTO, has increased hip strength and less pain overall with squatting .  She will try 1 more visit of Dry needling to see if that can make a difference in her pain .  Hip hinging on knees vs standing did not increase pain as much, even with a weight.  Hip flexors are not tight but she has limited hip extension actively, glutes are 4/5 in small ROM.    OBJECTIVE IMPAIRMENTS decreased mobility, difficulty walking, decreased ROM, decreased strength, hypomobility, increased fascial restrictions, postural dysfunction, and pain.     ACTIVITY LIMITATIONS standing, squatting, and locomotion level   PARTICIPATION LIMITATIONS: shopping, community activity, occupation, and fitness   PERSONAL FACTORS Time since onset of injury/illness/exacerbation are also affecting patient's functional outcome.    REHAB POTENTIAL: Excellent   CLINICAL DECISION MAKING: Stable/uncomplicated   EVALUATION COMPLEXITY: Low     GOALS:     LONG TERM GOALS: Target date: 07/20/2022   Pt will be I with HEP for hips, core  Baseline:  Goal status: ongoing    2.  Pt will be able to show 4/5 or better strength in hips  Baseline: 3-/5 to 3:  Goal status: MET, still addressing    3.  Pt will improve FOTO core to 65% or better to show functional improvement Baseline: update 50% Goal status: ongoing    4.  Pt will be able to squat with 20-25 lbs and no increase in back pain .  Baseline: update: better form and less pain  Goal status: ongoing    5.  Pt will be able to walk for 30 min with min increase in back pain < 3/10  Baseline: update has not walked much  Goal status: ongoing     PLAN: PT FREQUENCY: 2x/week   PT DURATION: 6 weeks   PLANNED INTERVENTIONS: Therapeutic exercises, Therapeutic activity, Neuromuscular re-education, Balance training, Patient/Family education, Self Care, Joint mobilization, Spinal mobilization, Cryotherapy, Moist heat, Manual therapy, and Re-evaluation.   PLAN FOR NEXT SESSION:  check HEP , body weight squat , glute strength.  Progress lifts. Pilates?; assess response to TPDN potential need for further TPDN to multifidi pending response; lumbar manip?   Raeford Razor, PT 06/28/22 11:39 AM Phone: [phone] Fax: [phone]

## 2022-07-02 ENCOUNTER — Ambulatory Visit: Payer: PRIVATE HEALTH INSURANCE

## 2022-07-02 DIAGNOSIS — M5459 Other low back pain: Secondary | ICD-10-CM | POA: Diagnosis not present

## 2022-07-02 DIAGNOSIS — M2560 Stiffness of unspecified joint, not elsewhere classified: Secondary | ICD-10-CM

## 2022-07-02 DIAGNOSIS — M6281 Muscle weakness (generalized): Secondary | ICD-10-CM

## 2022-07-02 NOTE — Therapy (Signed)
OUTPATIENT PHYSICAL THERAPY TREATMENT NOTE   Patient Name: Lisa Bradford MRN: [id number] DOB:14-Apr-1971, 51 y.o., female Today's Date: 07/02/2022  PCP: Glennon Mac, DO   REFERRING PROVIDER: Burnis Medin   END OF SESSION:   PT End of Session - 07/02/22 1626     Visit Number 8    Number of Visits 13    Date for PT Re-Evaluation 07/20/22    Authorization Type [employer] ([place] share)    PT Start Time 1625    PT Stop Time 1713    PT Time Calculation (min) 48 min    Activity Tolerance Patient tolerated treatment well    Behavior During Therapy WFL for tasks assessed/performed                 Past Medical History:  Diagnosis Date   Allergy    GERD (gastroesophageal reflux disease) 12/26/2011   Heart murmur    echo in 98   Hypothyroidism    pos antibodies  goiter    THROMBOCYTOPENIA 02/01/2009   Qualifier: Diagnosis of  By: Regis Bill MD, Standley Brooking    Past Surgical History:  Procedure Laterality Date   CERVICAL POLYPECTOMY     Patient Active Problem List   Diagnosis Date Noted   Screening breast examination 05/05/2019   Breast pain, right 05/05/2019   Plantar fasciitis of left foot 12/30/2013   Obesity (BMI 30-39.9) 12/30/2013   Visit for preventive health examination 12/30/2013   Hyperlipidemia 12/30/2013   Recurrent acute tonsillitis 03/13/2013   Mycotic toenails 12/26/2011   Allergic rhinitis 12/13/2008   Hypothyroidism 10/08/2007   NECK PAIN 10/08/2007   HEADACHE 10/08/2007    REFERRING DIAG:M54.50,G89.29 (ICD-10-CM) - Chronic right-sided low back pain without sciatica M54.6,G89.29 (ICD-10-CM) - Chronic bilateral thoracic back pain   THERAPY DIAG:  Other low back pain  Joint stiffness of spine  Muscle weakness (generalized)  Rationale for Evaluation and Treatment Rehabilitation  PERTINENT HISTORY: See above   PRECAUTIONS: none   SUBJECTIVE: Patient reports the pain has been minimal today. She reports no bruising from TPDN last week.     PAIN:  Are you having pain? Yes: NPRS scale: 2/10 Pain location: central low back  Pain description: tight  Aggravating factors: squatting , walking and standing incr pain  Relieving factors: nothing that I have found other than sitting    OBJECTIVE: (objective measures completed at initial evaluation unless otherwise dated)  DIAGNOSTIC FINDINGS:  Mild to moderate anterior disc space narrowing of the upper thoracic spine.     Mild-to-moderate L5-S1 and mild posterior L4-5 disc space narrowing.     PATIENT SURVEYS:  FOTO 56% (risk adj)    SCREENING FOR RED FLAGS: Bowel or bladder incontinence: No Spinal tumors: No Cauda equina syndrome: No Compression fracture: No Abdominal aneurysm: No   COGNITION:           Overall cognitive status: Within functional limits for tasks assessed                          SENSATION: WFL   MUSCLE LENGTH: Hamstrings: WFL Thomas test:WFL    POSTURE: decreased lumbar lordosis    PALPATION: TTP centrally L3-L4-L5, no soreness in glutes   07/02/22: Rt iliac crest and ASIS elevated in standing; L3-L5 hypomobility with pain PAIVM   LUMBAR ROM:    Active  A/PROM  eval  Flexion WNL  Extension 25%  Right lateral flexion 10  Left lateral flexion 10  Right rotation Min pain WNL   Left rotation Min pain WNL    (Blank rows = not tested)   LOWER EXTREMITY ROM:    WNL throughout LEs  AROM  Right eval Left eval  Hip flexion      Hip extension      Hip abduction      Hip adduction      Hip internal rotation      Hip external rotation      Knee flexion      Knee extension      Ankle dorsiflexion      Ankle plantarflexion      Ankle inversion      Ankle eversion       (Blank rows = not tested)   LOWER EXTREMITY MMT:     MMT Right eval Left eval Rt Lt.   Hip flexion 4+ 4+ 5 5  Hip extension 3- 3- 4 4  Hip abduction 3- 3- 4 4+  Hip adduction        Hip internal rotation        Hip external rotation        Knee flexion  _0 Knee extension _1 Ankle dorsiflexion _2 Ankle plantarflexion        Ankle inversion        Ankle eversion         (Blank rows = not tested)    LUMBAR SPECIAL TESTS:  Straight leg raise test: Negative Neg Trendelenburg    FUNCTIONAL TESTS:  Squat : poor hip hinge, quad dominant   GAIT: Distance walked: 150 Assistive device utilized: None Level of assistance: I          Comments: no deviations noted      [facility] Adult PT Treatment:                                                DATE: 07/02/22 Therapeutic Exercise: Cat cow x 10 Prone pressup x 10  Sidelying thoracic rotation x 10 each  Figure 4 stretch 2 x 30 sec each  Prone hip extension 2 x 10  Manual Therapy: L1-L5 CPAs grade II-V (no cavitation)  Muscle energy technique to improve pelvic alignment.  Trigger Point Dry Needling Treatment: Pre-treatment instruction: Patient instructed on dry needling rationale, procedures, and possible side effects including pain during treatment (achy,cramping feeling), bruising, drop of blood, lightheadedness, nausea, sweating. Patient Consent Given: Yes Education handout provided: Previously provided Muscles treated: L3-5 multifidi; Rt glute med/mmin   Treatment response/outcome:  slight improvement in PAIVM with less pain  Post-treatment instructions: Patient instructed to expect possible mild to moderate muscle soreness later today and/or tomorrow. Patient instructed in methods to reduce muscle soreness and to continue prescribed HEP. If patient was dry needled over the lung field, patient was instructed on signs and symptoms of pneumothorax and, however unlikely, to see immediate medical attention should they occur. Patient was also educated on signs and symptoms of infection and to seek medical attention should they occur. Patient verbalized understanding of these instructions and education.   [facility] Adult PT Treatment:  DATE:  06/28/22 Therapeutic Exercise: Bridge with band articulation  Banded march in bridge Banded bridge with 10 lbs  Hip flexor 1/2 kneeling  Dead lift on knees with and without 10 lbs dumbbells (less pain with wgts) Tall kneeling "matrix" x 10 with 10 lbs  Bird dog with arm and hip extension x 10 each side  Manual Therapy: Prone manual to L3-L4-L5, rotational mobs Gr II, decompression to Sacrum Sidelying caudal pressure to Rt pelvic crest for elongation  Self Care: Progress, goals, FOTO    [facility] Adult PT Treatment:                                                DATE: 06/26/22 Therapeutic Exercise: Supine pelvic tilts 2 x 10  Seated hip hinge with dowel x 10  Standing hip hinge with dowel x 10  Deadlift with 5 lbs 2 x 10 Deadlift with 10 lbs attempted increased pain. Resisted deadlift with green band x 10 Updated HEP  Manual Therapy: STM bilateral lumbar paraspinals CPAs grade II L-spine Trigger Point Dry Needling Treatment: Pre-treatment instruction: Patient instructed on dry needling rationale, procedures, and possible side effects including pain during treatment (achy,cramping feeling), bruising, drop of blood, lightheadedness, nausea, sweating. Patient Consent Given: Yes Education handout provided: Yes Muscles treated: Rt lumbar paraspinals; Rt L4 Multifidi   Needle size and number: .30x71m x 2 Electrical stimulation performed: No Parameters: N/A Treatment response/outcome: Twitch response elicited and Palpable decrease in muscle tension Post-treatment instructions: Patient instructed to expect possible mild to moderate muscle soreness later today and/or tomorrow. Patient instructed in methods to reduce muscle soreness and to continue prescribed HEP. If patient was dry needled over the lung field, patient was instructed on signs and symptoms of pneumothorax and, however unlikely, to see immediate medical attention should they occur. Patient was also educated on signs and symptoms of  infection and to seek medical attention should they occur. Patient verbalized understanding of these instructions and education.    PATIENT EDUCATION:  Education details: TPDN   Person educated: Patient Education method: Explanation Education comprehension: verbalized understanding      HOME EXERCISE PROGRAM: Access Code: [email or web address] URL: [email or web address] Date: 06/08/2022 Prepared by: JRaeford Razor Access Code: [email or web address] URL: [email or web address] Date: 06/14/2022 Prepared by: JRaeford Razor Exercises - Sidelying Hip Abduction  - 2 x daily - 7 x weekly - 2 sets - 10 reps - 5 hold - Clamshell with Resistance  - 2 x daily - 7 x weekly - 2 sets - 10 reps - 5 hold - Quadruped on Forearms Hip Extension  - 2 x daily - 7 x weekly - 2 sets - 10 reps - 5 hold - Squat with Chair Touch  - 2 x daily - 7 x weekly - 2 sets - 10 reps - 5 hold - Standing Hip Abduction with Counter Support  - 1 x daily - 7 x weekly - 2 sets - 10 reps - 30 hold - Reverse Lunge on Slider  - 1 x daily - 7 x weekly - 2 sets - 10 reps - Standing Quadriceps Stretch  - 1 x daily - 7 x weekly - 1 sets - 3 reps - 30 hold - Supine Hamstring Stretch with Strap  - 1 x daily - 7 x weekly - 1 sets - 3 reps - 30 hold - Supine ITB  Stretch with Strap  - 1 x daily - 7 x weekly - 1 sets - 3 reps - 30 hold - Supine Piriformis Stretch with Leg Straight  - 1 x daily - 7 x weekly - 1 sets - 3 reps - 30 hold  ASSESSMENT:   CLINICAL IMPRESSION: Further TPDN was performed to lumbar and gluteal musculature with patient noted to have slight improvement in L-spine mobility and reduced pain with PAIVM following intervention. She is noted to have Lt anterior rotated innominate that easily corrects with muscle energy technique. There ex focused on improving spinal mobility, which she tolerated well. She is noted to initially have delayed gluteal firing with prone hip extension, but with cueing is able to correct.     OBJECTIVE IMPAIRMENTS decreased mobility, difficulty walking, decreased ROM, decreased strength, hypomobility, increased fascial restrictions, postural dysfunction, and pain.    ACTIVITY LIMITATIONS standing, squatting, and locomotion level   PARTICIPATION LIMITATIONS: shopping, community activity, occupation, and fitness   PERSONAL FACTORS Time since onset of injury/illness/exacerbation are also affecting patient's functional outcome.    REHAB POTENTIAL: Excellent   CLINICAL DECISION MAKING: Stable/uncomplicated   EVALUATION COMPLEXITY: Low     GOALS:     LONG TERM GOALS: Target date: 07/20/2022   Pt will be I with HEP for hips, core  Baseline:  Goal status: ongoing    2.  Pt will be able to show 4/5 or better strength in hips  Baseline: 3-/5 to 3:  Goal status: MET, still addressing    3.  Pt will improve FOTO core to 65% or better to show functional improvement Baseline: update 50% Goal status: ongoing    4.  Pt will be able to squat with 20-25 lbs and no increase in back pain .  Baseline: update: better form and less pain  Goal status: ongoing    5.  Pt will be able to walk for 30 min with min increase in back pain < 3/10  Baseline: update has not walked much  Goal status: ongoing     PLAN: PT FREQUENCY: 2x/week   PT DURATION: 6 weeks   PLANNED INTERVENTIONS: Therapeutic exercises, Therapeutic activity, Neuromuscular re-education, Balance training, Patient/Family education, Self Care, Joint mobilization, Spinal mobilization, Cryotherapy, Moist heat, Manual therapy, and Re-evaluation.   PLAN FOR NEXT SESSION:  check HEP , body weight squat , glute strength.  Progress lifts. Pilates?; assess response to TPDN; lumbar manip?    Gwendolyn Grant, PT, DPT, ATC 07/02/22 5:14 PM

## 2022-07-02 NOTE — Addendum Note (Signed)
Addended by: Raeford Razor L on: 07/02/2022 04:24 PM   Modules accepted: Orders

## 2022-07-03 ENCOUNTER — Encounter: Payer: No Typology Code available for payment source | Admitting: Physical Therapy

## 2022-07-05 ENCOUNTER — Encounter: Payer: Self-pay | Admitting: Physical Therapy

## 2022-07-05 ENCOUNTER — Ambulatory Visit: Payer: PRIVATE HEALTH INSURANCE | Admitting: Physical Therapy

## 2022-07-05 DIAGNOSIS — M2560 Stiffness of unspecified joint, not elsewhere classified: Secondary | ICD-10-CM

## 2022-07-05 DIAGNOSIS — M5459 Other low back pain: Secondary | ICD-10-CM | POA: Diagnosis not present

## 2022-07-05 DIAGNOSIS — M6281 Muscle weakness (generalized): Secondary | ICD-10-CM

## 2022-07-05 NOTE — Therapy (Signed)
OUTPATIENT PHYSICAL THERAPY TREATMENT NOTE   Patient Name: Lisa Bradford MRN: [id number] DOB:24-Nov-1970, 51 y.o., female Today's Date: 07/05/2022  PCP: Glennon Mac, DO   REFERRING PROVIDER: Burnis Medin   END OF SESSION:   PT End of Session - 07/05/22 1020     Visit Number 9    Number of Visits 13    Date for PT Re-Evaluation 07/20/22    Authorization Type [employer] ([place] share)    PT Start Time 1020    PT Stop Time 1100    PT Time Calculation (min) 40 min    Activity Tolerance Patient tolerated treatment well    Behavior During Therapy WFL for tasks assessed/performed                 Past Medical History:  Diagnosis Date   Allergy    GERD (gastroesophageal reflux disease) 12/26/2011   Heart murmur    echo in 98   Hypothyroidism    pos antibodies  goiter    THROMBOCYTOPENIA 02/01/2009   Qualifier: Diagnosis of  By: Regis Bill MD, Standley Brooking    Past Surgical History:  Procedure Laterality Date   CERVICAL POLYPECTOMY     Patient Active Problem List   Diagnosis Date Noted   Screening breast examination 05/05/2019   Breast pain, right 05/05/2019   Plantar fasciitis of left foot 12/30/2013   Obesity (BMI 30-39.9) 12/30/2013   Visit for preventive health examination 12/30/2013   Hyperlipidemia 12/30/2013   Recurrent acute tonsillitis 03/13/2013   Mycotic toenails 12/26/2011   Allergic rhinitis 12/13/2008   Hypothyroidism 10/08/2007   NECK PAIN 10/08/2007   HEADACHE 10/08/2007    REFERRING DIAG:M54.50,G89.29 (ICD-10-CM) - Chronic right-sided low back pain without sciatica M54.6,G89.29 (ICD-10-CM) - Chronic bilateral thoracic back pain   THERAPY DIAG:  Other low back pain  Joint stiffness of spine  Muscle weakness (generalized)  Rationale for Evaluation and Treatment Rehabilitation  PERTINENT HISTORY: See above   PRECAUTIONS: none   SUBJECTIVE: No more stabby pain. Over did it Sunday but really feeling like I am making progress. Was  not bruised from needling.    PAIN:  Are you having pain? Yes: NPRS scale: 2/10 Pain location: central low back  Pain description: tight  Aggravating factors: squatting , walking and standing incr pain  Relieving factors: nothing that I have found other than sitting    OBJECTIVE: (objective measures completed at initial evaluation unless otherwise dated)  DIAGNOSTIC FINDINGS:  Mild to moderate anterior disc space narrowing of the upper thoracic spine.     Mild-to-moderate L5-S1 and mild posterior L4-5 disc space narrowing.     PATIENT SURVEYS:  FOTO 56% (risk adj)    SCREENING FOR RED FLAGS: Bowel or bladder incontinence: No Spinal tumors: No Cauda equina syndrome: No Compression fracture: No Abdominal aneurysm: No   COGNITION:           Overall cognitive status: Within functional limits for tasks assessed                          SENSATION: WFL   MUSCLE LENGTH: Hamstrings: WFL Thomas test:WFL    POSTURE: decreased lumbar lordosis    PALPATION: TTP centrally L3-L4-L5, no soreness in glutes   07/02/22: Rt iliac crest and ASIS elevated in standing; L3-L5 hypomobility with pain PAIVM   LUMBAR ROM:    Active  A/PROM  eval  Flexion WNL  Extension 25%  Right lateral flexion 10  Left lateral flexion 10  Right rotation Min pain WNL   Left rotation Min pain WNL    (Blank rows = not tested)   LOWER EXTREMITY ROM:    WNL throughout LEs  AROM  Right eval Left eval  Hip flexion      Hip extension      Hip abduction      Hip adduction      Hip internal rotation      Hip external rotation      Knee flexion      Knee extension      Ankle dorsiflexion      Ankle plantarflexion      Ankle inversion      Ankle eversion       (Blank rows = not tested)   LOWER EXTREMITY MMT:     MMT Right eval Left eval Rt Lt.   Hip flexion 4+ 4+ 5 5  Hip extension 3- 3- 4 4  Hip abduction 3- 3- 4 4+  Hip adduction        Hip internal rotation        Hip external  rotation        Knee flexion _0 Knee extension _1 Ankle dorsiflexion _2 Ankle plantarflexion        Ankle inversion        Ankle eversion         (Blank rows = not tested)    LUMBAR SPECIAL TESTS:  Straight leg raise test: Negative Neg Trendelenburg    FUNCTIONAL TESTS:  Squat : poor hip hinge, quad dominant   GAIT: Distance walked: 150 Assistive device utilized: None Level of assistance: I          Comments: no deviations noted     [facility] Adult PT Treatment:                                                DATE: 07/05/22 Therapeutic Exercise: Elliptical 5 min L1 resist and L 10 ramp  Articulating bridge x 10  Bridge with pelvic drop x 3 levels  Single leg bridge x 5 sec x 5  Knees crossed with rotation Rt.  over Rt. For Rt SIJ opening  Cat and cow x 5  Quadruped hip external x 15 then donkey kick x 15 Squat 25 lbs, 30 lbs x 15 RDL 25 lbs , 30 lbs x  15  Step ups 15 lbs x 15 each with hip hinge and opp leg knee lift , min increase in back pain with this  Manual Therapy: Manipulation lumbar spine Rt by Voncille Lo, PT    [facility] Adult PT Treatment:                                                DATE: 07/02/22 Therapeutic Exercise: Cat cow x 10 Prone pressup x 10  Sidelying thoracic rotation x 10 each  Figure 4 stretch 2 x 30 sec each  Prone hip extension 2 x 10  Manual Therapy: L1-L5 CPAs grade II-V (no cavitation)  Muscle energy technique to improve pelvic alignment.  Trigger Point Dry Needling Treatment: Pre-treatment instruction: Patient instructed  on dry needling rationale, procedures, and possible side effects including pain during treatment (achy,cramping feeling), bruising, drop of blood, lightheadedness, nausea, sweating. Patient Consent Given: Yes Education handout provided: Previously provided Muscles treated: L3-5 multifidi; Rt glute med/mmin   Treatment response/outcome:  slight improvement in PAIVM with less pain  Post-treatment  instructions: Patient instructed to expect possible mild to moderate muscle soreness later today and/or tomorrow. Patient instructed in methods to reduce muscle soreness and to continue prescribed HEP. If patient was dry needled over the lung field, patient was instructed on signs and symptoms of pneumothorax and, however unlikely, to see immediate medical attention should they occur. Patient was also educated on signs and symptoms of infection and to seek medical attention should they occur. Patient verbalized understanding of these instructions and education.   [facility] Adult PT Treatment:                                                DATE: 06/28/22 Therapeutic Exercise: Bridge with band articulation  Banded march in bridge Banded bridge with 10 lbs  Hip flexor 1/2 kneeling  Dead lift on knees with and without 10 lbs dumbbells (less pain with wgts) Tall kneeling "matrix" x 10 with 10 lbs  Bird dog with arm and hip extension x 10 each side  Manual Therapy: Prone manual to L3-L4-L5, rotational mobs Gr II, decompression to Sacrum Sidelying caudal pressure to Rt pelvic crest for elongation  Self Care: Progress, goals, FOTO    [facility] Adult PT Treatment:                                                DATE: 06/26/22 Therapeutic Exercise: Supine pelvic tilts 2 x 10  Seated hip hinge with dowel x 10  Standing hip hinge with dowel x 10  Deadlift with 5 lbs 2 x 10 Deadlift with 10 lbs attempted increased pain. Resisted deadlift with green band x 10 Updated HEP  Manual Therapy: STM bilateral lumbar paraspinals CPAs grade II L-spine Trigger Point Dry Needling Treatment: Pre-treatment instruction: Patient instructed on dry needling rationale, procedures, and possible side effects including pain during treatment (achy,cramping feeling), bruising, drop of blood, lightheadedness, nausea, sweating. Patient Consent Given: Yes Education handout provided: Yes Muscles treated: Rt lumbar paraspinals; Rt L4  Multifidi   Needle size and number: .30x59m x 2 Electrical stimulation performed: No Parameters: N/A Treatment response/outcome: Twitch response elicited and Palpable decrease in muscle tension Post-treatment instructions: Patient instructed to expect possible mild to moderate muscle soreness later today and/or tomorrow. Patient instructed in methods to reduce muscle soreness and to continue prescribed HEP. If patient was dry needled over the lung field, patient was instructed on signs and symptoms of pneumothorax and, however unlikely, to see immediate medical attention should they occur. Patient was also educated on signs and symptoms of infection and to seek medical attention should they occur. Patient verbalized understanding of these instructions and education.    PATIENT EDUCATION:  Education details: TPDN   Person educated: Patient Education method: Explanation Education comprehension: verbalized understanding      HOME EXERCISE PROGRAM: Access Code: [email or web address] URL: [email or web address] Date: 06/08/2022 Prepared by: JRaeford Razor Access Code: [email or web address] URL: [email or web address] Date: 06/14/2022 Prepared by: JAnderson Malta  Srihitha Tagliaferri  Exercises - Sidelying Hip Abduction  - 2 x daily - 7 x weekly - 2 sets - 10 reps - 5 hold - Clamshell with Resistance  - 2 x daily - 7 x weekly - 2 sets - 10 reps - 5 hold - Quadruped on Forearms Hip Extension  - 2 x daily - 7 x weekly - 2 sets - 10 reps - 5 hold - Squat with Chair Touch  - 2 x daily - 7 x weekly - 2 sets - 10 reps - 5 hold - Standing Hip Abduction with Counter Support  - 1 x daily - 7 x weekly - 2 sets - 10 reps - 30 hold - Reverse Lunge on Slider  - 1 x daily - 7 x weekly - 2 sets - 10 reps - Standing Quadriceps Stretch  - 1 x daily - 7 x weekly - 1 sets - 3 reps - 30 hold - Supine Hamstring Stretch with Strap  - 1 x daily - 7 x weekly - 1 sets - 3 reps - 30 hold - Supine ITB Stretch with Strap  - 1 x daily - 7  x weekly - 1 sets - 3 reps - 30 hold - Supine Piriformis Stretch with Leg Straight  - 1 x daily - 7 x weekly - 1 sets - 3 reps - 30 hold  ASSESSMENT:   CLINICAL IMPRESSION: Patient able to report no more "stabbing pain" that she felt fairly consistently since the last dry needling session. Addressed spinal mobility and loading glutes. She was able to dead lift 30 lbs which hse had not been able to do.  She began to have some discomfort in Rt low back (L5) with step ups.  Sees MD next week and will schedule 2 more visits before POC ends.  Recommended cash based dry needling if needed beyond POC.    OBJECTIVE IMPAIRMENTS decreased mobility, difficulty walking, decreased ROM, decreased strength, hypomobility, increased fascial restrictions, postural dysfunction, and pain.    ACTIVITY LIMITATIONS standing, squatting, and locomotion level   PARTICIPATION LIMITATIONS: shopping, community activity, occupation, and fitness   PERSONAL FACTORS Time since onset of injury/illness/exacerbation are also affecting patient's functional outcome.    REHAB POTENTIAL: Excellent   CLINICAL DECISION MAKING: Stable/uncomplicated   EVALUATION COMPLEXITY: Low     GOALS:     LONG TERM GOALS: Target date: 07/20/2022   Pt will be I with HEP for hips, core  Baseline:  Goal status: ongoing    2.  Pt will be able to show 4/5 or better strength in hips  Baseline: 3-/5 to 3:  Goal status: MET, still addressing    3.  Pt will improve FOTO core to 65% or better to show functional improvement Baseline: update 50% Goal status: ongoing    4.  Pt will be able to squat with 20-25 lbs and no increase in back pain .  Baseline: update: better form and less pain  Goal status: ongoing    5.  Pt will be able to walk for 30 min with min increase in back pain < 3/10  Baseline: update has not walked much  Goal status: ongoing     PLAN: PT FREQUENCY: 2x/week   PT DURATION: 6 weeks   PLANNED INTERVENTIONS:  Therapeutic exercises, Therapeutic activity, Neuromuscular re-education, Balance training, Patient/Family education, Self Care, Joint mobilization, Spinal mobilization, Cryotherapy, Moist heat, Manual therapy, and Re-evaluation.   PLAN FOR NEXT SESSION:  progress dead lifts, try bar. Repeat  TPDN; lumbar manip?    Raeford Razor, PT 07/05/22 11:12 AM Phone: [phone] Fax: [phone]

## 2022-07-09 ENCOUNTER — Ambulatory Visit: Payer: PRIVATE HEALTH INSURANCE

## 2022-07-09 DIAGNOSIS — M2560 Stiffness of unspecified joint, not elsewhere classified: Secondary | ICD-10-CM

## 2022-07-09 DIAGNOSIS — M5459 Other low back pain: Secondary | ICD-10-CM | POA: Diagnosis not present

## 2022-07-09 DIAGNOSIS — M6281 Muscle weakness (generalized): Secondary | ICD-10-CM

## 2022-07-09 NOTE — Therapy (Signed)
OUTPATIENT PHYSICAL THERAPY TREATMENT NOTE   Patient Name: Lisa Bradford MRN: [id number] DOB:09-Feb-1971, 51 y.o., female Today's Date: 07/09/2022  PCP: Glennon Mac, DO   REFERRING PROVIDER: Burnis Medin   END OF SESSION:   PT End of Session - 07/09/22 1459     Visit Number 10    Number of Visits 13    Date for PT Re-Evaluation 07/20/22    Authorization Type [employer] ([place] share)    PT Start Time 1500    PT Stop Time 1545    PT Time Calculation (min) 45 min    Activity Tolerance Patient tolerated treatment well    Behavior During Therapy [facility] for tasks assessed/performed                  Past Medical History:  Diagnosis Date   Allergy    GERD (gastroesophageal reflux disease) 12/26/2011   Heart murmur    echo in 98   Hypothyroidism    pos antibodies  goiter    THROMBOCYTOPENIA 02/01/2009   Qualifier: Diagnosis of  By: Regis Bill MD, Standley Brooking    Past Surgical History:  Procedure Laterality Date   CERVICAL POLYPECTOMY     Patient Active Problem List   Diagnosis Date Noted   Screening breast examination 05/05/2019   Breast pain, right 05/05/2019   Plantar fasciitis of left foot 12/30/2013   Obesity (BMI 30-39.9) 12/30/2013   Visit for preventive health examination 12/30/2013   Hyperlipidemia 12/30/2013   Recurrent acute tonsillitis 03/13/2013   Mycotic toenails 12/26/2011   Allergic rhinitis 12/13/2008   Hypothyroidism 10/08/2007   NECK PAIN 10/08/2007   HEADACHE 10/08/2007    REFERRING DIAG:M54.50,G89.29 (ICD-10-CM) - Chronic right-sided low back pain without sciatica M54.6,G89.29 (ICD-10-CM) - Chronic bilateral thoracic back pain   THERAPY DIAG:  Other low back pain  Joint stiffness of spine  Muscle weakness (generalized)  Rationale for Evaluation and Treatment Rehabilitation  PERTINENT HISTORY: See above   PRECAUTIONS: none   SUBJECTIVE: "A little sore today. Hoping to do some dry needling."    PAIN:  Are you having pain?  Yes: NPRS scale: 3/10 Pain location: central low back  Pain description: sore  Aggravating factors: squatting , walking and standing incr pain  Relieving factors: nothing that I have found other than sitting    OBJECTIVE: (objective measures completed at initial evaluation unless otherwise dated)  DIAGNOSTIC FINDINGS:  Mild to moderate anterior disc space narrowing of the upper thoracic spine.     Mild-to-moderate L5-S1 and mild posterior L4-5 disc space narrowing.     PATIENT SURVEYS:  FOTO 56% (risk adj)    SCREENING FOR RED FLAGS: Bowel or bladder incontinence: No Spinal tumors: No Cauda equina syndrome: No Compression fracture: No Abdominal aneurysm: No   COGNITION:           Overall cognitive status: Within functional limits for tasks assessed                          SENSATION: WFL   MUSCLE LENGTH: Hamstrings: WFL Thomas test:WFL    POSTURE: decreased lumbar lordosis    PALPATION: TTP centrally L3-L4-L5, no soreness in glutes   07/02/22: Rt iliac crest and ASIS elevated in standing; L3-L5 hypomobility with pain PAIVM   LUMBAR ROM:    Active  A/PROM  eval  Flexion WNL  Extension 25%  Right lateral flexion 10  Left lateral flexion 10  Right rotation Min pain WNL  Left rotation Min pain WNL    (Blank rows = not tested)   LOWER EXTREMITY ROM:    WNL throughout LEs  AROM  Right eval Left eval  Hip flexion      Hip extension      Hip abduction      Hip adduction      Hip internal rotation      Hip external rotation      Knee flexion      Knee extension      Ankle dorsiflexion      Ankle plantarflexion      Ankle inversion      Ankle eversion       (Blank rows = not tested)   LOWER EXTREMITY MMT:     MMT Right eval Left eval Rt Lt.   Hip flexion 4+ 4+ 5 5  Hip extension 3- 3- 4 4  Hip abduction 3- 3- 4 4+  Hip adduction        Hip internal rotation        Hip external rotation        Knee flexion _0 Knee extension _1 Ankle dorsiflexion _2 Ankle plantarflexion        Ankle inversion        Ankle eversion         (Blank rows = not tested)    LUMBAR SPECIAL TESTS:  Straight leg raise test: Negative Neg Trendelenburg    FUNCTIONAL TESTS:  Squat : poor hip hinge, quad dominant   GAIT: Distance walked: 150 Assistive device utilized: None Level of assistance: I          Comments: no deviations noted   [facility] Adult PT Treatment:                                                DATE: 07/09/22 Therapeutic Exercise: Prone Pressup x 10  Cat cow x 10  Prone pressup with strap x 10  Standing lumbar extension x 10  Supine crossover stretch x 30 sec each  Manual Therapy: L1-L5 CPAs grade II-IV STM bilateral lumbar paraspinals; Rt gluteals/piriformis Lumbar manipulation performed by Carlus Pavlov, PT, DPT, ATC (no cavitation)  Trigger Point Dry Needling Treatment: Pre-treatment instruction: Patient instructed on dry needling rationale, procedures, and possible side effects including pain during treatment (achy,cramping feeling), bruising, drop of blood, lightheadedness, nausea, sweating. Patient Consent Given: Yes Education handout provided: Previously provided Muscles treated: L2-5 multifidi  Treatment response/outcome: slight improvement in PAIVM with less pain  Post-treatment instructions: Patient instructed to expect possible mild to moderate muscle soreness later today and/or tomorrow. Patient instructed in methods to reduce muscle soreness and to continue prescribed HEP. If patient was dry needled over the lung field, patient was instructed on signs and symptoms of pneumothorax and, however unlikely, to see immediate medical attention should they occur. Patient was also educated on signs and symptoms of infection and to seek medical attention should they occur. Patient verbalized understanding of these instructions and education.    [facility] Adult PT Treatment:  DATE: 07/05/22 Therapeutic Exercise: Elliptical 5 min L1 resist and L 10 ramp  Articulating bridge x 10  Bridge with pelvic drop x 3 levels  Single leg bridge x 5 sec x 5  Knees crossed with rotation Rt.  over Rt. For Rt SIJ opening  Cat and cow x 5  Quadruped hip external x 15 then donkey kick x 15 Squat 25 lbs, 30 lbs x 15 RDL 25 lbs , 30 lbs x  15  Step ups 15 lbs x 15 each with hip hinge and opp leg knee lift , min increase in back pain with this  Manual Therapy: Manipulation lumbar spine Rt by Voncille Lo, PT    [facility] Adult PT Treatment:                                                DATE: 07/02/22 Therapeutic Exercise: Cat cow x 10 Prone pressup x 10  Sidelying thoracic rotation x 10 each  Figure 4 stretch 2 x 30 sec each  Prone hip extension 2 x 10  Manual Therapy: L1-L5 CPAs grade II-V (no cavitation)  Muscle energy technique to improve pelvic alignment.  Trigger Point Dry Needling Treatment: Pre-treatment instruction: Patient instructed on dry needling rationale, procedures, and possible side effects including pain during treatment (achy,cramping feeling), bruising, drop of blood, lightheadedness, nausea, sweating. Patient Consent Given: Yes Education handout provided: Previously provided Muscles treated: L3-5 multifidi; Rt glute med/mmin   Treatment response/outcome:  slight improvement in PAIVM with less pain  Post-treatment instructions: Patient instructed to expect possible mild to moderate muscle soreness later today and/or tomorrow. Patient instructed in methods to reduce muscle soreness and to continue prescribed HEP. If patient was dry needled over the lung field, patient was instructed on signs and symptoms of pneumothorax and, however unlikely, to see immediate medical attention should they occur. Patient was also educated on signs and symptoms of infection and to seek medical attention should they occur. Patient verbalized understanding of these instructions  and education.   [facility] Adult PT Treatment:                                                DATE: 06/28/22 Therapeutic Exercise: Bridge with band articulation  Banded march in bridge Banded bridge with 10 lbs  Hip flexor 1/2 kneeling  Dead lift on knees with and without 10 lbs dumbbells (less pain with wgts) Tall kneeling "matrix" x 10 with 10 lbs  Bird dog with arm and hip extension x 10 each side  Manual Therapy: Prone manual to L3-L4-L5, rotational mobs Gr II, decompression to Sacrum Sidelying caudal pressure to Rt pelvic crest for elongation  Self Care: Progress, goals, FOTO    [facility] Adult PT Treatment:                                                DATE: 06/26/22 Therapeutic Exercise: Supine pelvic tilts 2 x 10  Seated hip hinge with dowel x 10  Standing hip hinge with dowel x 10  Deadlift with 5 lbs 2 x 10 Deadlift with 10 lbs  attempted increased pain. Resisted deadlift with green band x 10 Updated HEP  Manual Therapy: STM bilateral lumbar paraspinals CPAs grade II L-spine Trigger Point Dry Needling Treatment: Pre-treatment instruction: Patient instructed on dry needling rationale, procedures, and possible side effects including pain during treatment (achy,cramping feeling), bruising, drop of blood, lightheadedness, nausea, sweating. Patient Consent Given: Yes Education handout provided: Yes Muscles treated: Rt lumbar paraspinals; Rt L4 Multifidi   Needle size and number: .30x59m x 2 Electrical stimulation performed: No Parameters: N/A Treatment response/outcome: Twitch response elicited and Palpable decrease in muscle tension Post-treatment instructions: Patient instructed to expect possible mild to moderate muscle soreness later today and/or tomorrow. Patient instructed in methods to reduce muscle soreness and to continue prescribed HEP. If patient was dry needled over the lung field, patient was instructed on signs and symptoms of pneumothorax and, however unlikely, to see  immediate medical attention should they occur. Patient was also educated on signs and symptoms of infection and to seek medical attention should they occur. Patient verbalized understanding of these instructions and education.    PATIENT EDUCATION:  Education details: TPDN   Person educated: Patient Education method: Explanation Education comprehension: verbalized understanding      HOME EXERCISE PROGRAM: Access Code: [email or web address] URL: [email or web address] Date: 06/08/2022 Prepared by: JRaeford Razor Access Code: [email or web address] URL: [email or web address] Date: 06/14/2022 Prepared by: JRaeford Razor Exercises - Sidelying Hip Abduction  - 2 x daily - 7 x weekly - 2 sets - 10 reps - 5 hold - Clamshell with Resistance  - 2 x daily - 7 x weekly - 2 sets - 10 reps - 5 hold - Quadruped on Forearms Hip Extension  - 2 x daily - 7 x weekly - 2 sets - 10 reps - 5 hold - Squat with Chair Touch  - 2 x daily - 7 x weekly - 2 sets - 10 reps - 5 hold - Standing Hip Abduction with Counter Support  - 1 x daily - 7 x weekly - 2 sets - 10 reps - 30 hold - Reverse Lunge on Slider  - 1 x daily - 7 x weekly - 2 sets - 10 reps - Standing Quadriceps Stretch  - 1 x daily - 7 x weekly - 1 sets - 3 reps - 30 hold - Supine Hamstring Stretch with Strap  - 1 x daily - 7 x weekly - 1 sets - 3 reps - 30 hold - Supine ITB Stretch with Strap  - 1 x daily - 7 x weekly - 1 sets - 3 reps - 30 hold - Supine Piriformis Stretch with Leg Straight  - 1 x daily - 7 x weekly - 1 sets - 3 reps - 30 hold  ASSESSMENT:   CLINICAL IMPRESSION: Pain and hypomobility remains about lower lumbar spine. TPDN and manual therapy focused on improving lumbar mobility.Following TPDN she has improved mobility and less pain with PAIVM at L4-L5, but did not fully resolve. There ex focused on spinal mobility with patient reporting a minor reduction in her low back soreness at conclusion of session.    OBJECTIVE IMPAIRMENTS  decreased mobility, difficulty walking, decreased ROM, decreased strength, hypomobility, increased fascial restrictions, postural dysfunction, and pain.    ACTIVITY LIMITATIONS standing, squatting, and locomotion level   PARTICIPATION LIMITATIONS: shopping, community activity, occupation, and fitness   PERSONAL FACTORS Time since onset of injury/illness/exacerbation are also affecting patient's functional outcome.    REHAB POTENTIAL: Excellent   CLINICAL DECISION MAKING: Stable/uncomplicated   EVALUATION COMPLEXITY:  Low     GOALS:     LONG TERM GOALS: Target date: 07/20/2022   Pt will be I with HEP for hips, core  Baseline:  Goal status: ongoing    2.  Pt will be able to show 4/5 or better strength in hips  Baseline: 3-/5 to 3:  Goal status: MET, still addressing    3.  Pt will improve FOTO core to 65% or better to show functional improvement Baseline: update 50% Goal status: ongoing    4.  Pt will be able to squat with 20-25 lbs and no increase in back pain .  Baseline: update: better form and less pain  Goal status: ongoing    5.  Pt will be able to walk for 30 min with min increase in back pain < 3/10  Baseline: update has not walked much  Goal status: ongoing     PLAN: PT FREQUENCY: 2x/week   PT DURATION: 6 weeks   PLANNED INTERVENTIONS: Therapeutic exercises, Therapeutic activity, Neuromuscular re-education, Balance training, Patient/Family education, Self Care, Joint mobilization, Spinal mobilization, Cryotherapy, Moist heat, Manual therapy, and Re-evaluation.   PLAN FOR NEXT SESSION:  progress dead lifts, try bar.   Gwendolyn Grant, PT, DPT, ATC 07/09/22 3:47 PM

## 2022-07-10 NOTE — Progress Notes (Unsigned)
    Lisa Bradford D.[place] Phone: [phone]   Assessment and Plan:     There are no diagnoses linked to this encounter.  ***   Pertinent previous records reviewed include ***   Follow Up: ***     Subjective:   I, Lisa Bradford, am serving as a Education administrator for Lisa Bradford   Chief Complaint: mid/low back pain   HPI:  05/10/2022 Patient is a 51 year old female complaining of mid/ low back pain. Patient states that she has had lbp more than a year that has gotten worse feb went to a chiro and that helped but the pain is coming back, she goes about once a month the pain is worst now since she has been going once a month , pain is mostly on the right side , no numbness or tingling, does get radiating pain to the hip sometimes a sharp stabbing pain , wasn't able to stand or walk for long periods of time is affecting ADLs, has not been taking any meds for the pain    05/31/2022 Patient states that the pain is a little bit better but it is still there , hurts more today than yesterday , is able to get lower in her squats    07/11/2022 Patient states    Relevant Historical Information: None pertinent  Additional pertinent review of systems negative.   Current Outpatient Medications:    cyclobenzaprine (FLEXERIL) 5 MG tablet, Take 1 tablet (5 mg total) by mouth at bedtime. (Patient not taking: Reported on 06/08/2022), Disp: 15 tablet, Rfl: 0   levonorgestrel (MIRENA) 20 MCG/24HR IUD, 1 each by Intrauterine route once., Disp: , Rfl:    levothyroxine (SYNTHROID) 100 MCG tablet, Take 1 tablet (100 mcg total) by mouth daily., Disp: 90 tablet, Rfl: 3   meloxicam (MOBIC) 15 MG tablet, Take 1 tablet (15 mg total) by mouth daily. (Patient not taking: Reported on 06/08/2022), Disp: 30 tablet, Rfl: 0   omeprazole (PRILOSEC) 20 MG capsule, Take 20 mg by mouth daily., Disp: , Rfl:    Objective:     There were no  vitals filed for this visit.    There is no height or weight on file to calculate BMI.    Physical Exam:    ***   Electronically signed by:  Lisa Bradford D.[facility] 7:40 AM 07/10/22

## 2022-07-11 ENCOUNTER — Ambulatory Visit (INDEPENDENT_AMBULATORY_CARE_PROVIDER_SITE_OTHER): Payer: No Typology Code available for payment source | Admitting: Sports Medicine

## 2022-07-11 VITALS — BP 110/68 | HR 68 | Ht 65.0 in | Wt 154.0 lb

## 2022-07-11 DIAGNOSIS — G8929 Other chronic pain: Secondary | ICD-10-CM | POA: Diagnosis not present

## 2022-07-11 DIAGNOSIS — M545 Low back pain, unspecified: Secondary | ICD-10-CM

## 2022-07-11 DIAGNOSIS — M9904 Segmental and somatic dysfunction of sacral region: Secondary | ICD-10-CM

## 2022-07-11 DIAGNOSIS — M9903 Segmental and somatic dysfunction of lumbar region: Secondary | ICD-10-CM

## 2022-07-11 DIAGNOSIS — M9905 Segmental and somatic dysfunction of pelvic region: Secondary | ICD-10-CM | POA: Diagnosis not present

## 2022-07-11 NOTE — Patient Instructions (Addendum)
Good to see you  MRI referral  Continue PT and HEP  Follow up 3 days after

## 2022-07-12 ENCOUNTER — Ambulatory Visit: Payer: PRIVATE HEALTH INSURANCE

## 2022-07-17 ENCOUNTER — Other Ambulatory Visit: Payer: No Typology Code available for payment source

## 2022-07-17 NOTE — Therapy (Addendum)
OUTPATIENT PHYSICAL THERAPY TREATMENT NOTE DISCHARGE   Patient Name: Lisa Bradford MRN: [id number] DOB:13-Apr-1971, 51 y.o., female Today's Date: 07/18/2022  PCP: Richardean Sale, DO   REFERRING PROVIDER: Madelin Headings   END OF SESSION:   PT End of Session - 07/18/22 1100     Visit Number 11    Number of Visits 13    Date for PT Re-Evaluation 07/20/22    Authorization Type [employer] ([employer])    PT Start Time 1100    PT Stop Time 1145    PT Time Calculation (min) 45 min    Activity Tolerance Patient tolerated treatment well    Behavior During Therapy WFL for tasks assessed/performed                   Past Medical History:  Diagnosis Date   Allergy    GERD (gastroesophageal reflux disease) 12/26/2011   Heart murmur    echo in 98   Hypothyroidism    pos antibodies  goiter    THROMBOCYTOPENIA 02/01/2009   Qualifier: Diagnosis of  By: Fabian Sharp MD, Neta Mends    Past Surgical History:  Procedure Laterality Date   CERVICAL POLYPECTOMY     Patient Active Problem List   Diagnosis Date Noted   Screening breast examination 05/05/2019   Breast pain, right 05/05/2019   Plantar fasciitis of left foot 12/30/2013   Obesity (BMI 30-39.9) 12/30/2013   Visit for preventive health examination 12/30/2013   Hyperlipidemia 12/30/2013   Recurrent acute tonsillitis 03/13/2013   Mycotic toenails 12/26/2011   Allergic rhinitis 12/13/2008   Hypothyroidism 10/08/2007   NECK PAIN 10/08/2007   HEADACHE 10/08/2007    REFERRING DIAG:M54.50,G89.29 (ICD-10-CM) - Chronic right-sided low back pain without sciatica M54.6,G89.29 (ICD-10-CM) - Chronic bilateral thoracic back pain   THERAPY DIAG:  Other low back pain  Joint stiffness of spine  Muscle weakness (generalized)  Rationale for Evaluation and Treatment Rehabilitation  PERTINENT HISTORY: See above   PRECAUTIONS: none   SUBJECTIVE: Was really sore over the weekend.  Was out of town and the bed was not  right.  Dr. Jean Rosenthal ordered a MRI for this weekend.  Would like to hold PT for now until results are known.   PAIN:  Are you having pain? Yes: NPRS scale: 2/10 Pain location: Rt low back  Pain description: sore  Aggravating factors: squatting , walking and standing incr pain  Relieving factors: nothing that I have found other than sitting    OBJECTIVE: (objective measures completed at initial evaluation unless otherwise dated)  DIAGNOSTIC FINDINGS:  Mild to moderate anterior disc space narrowing of the upper thoracic spine.     Mild-to-moderate L5-S1 and mild posterior L4-5 disc space narrowing.     PATIENT SURVEYS:  FOTO 56% (risk adj)    SCREENING FOR RED FLAGS: Bowel or bladder incontinence: No Spinal tumors: No Cauda equina syndrome: No Compression fracture: No Abdominal aneurysm: No   COGNITION:           Overall cognitive status: Within functional limits for tasks assessed                          SENSATION: WFL   MUSCLE LENGTH: Hamstrings: WFL Thomas test:WFL    POSTURE: decreased lumbar lordosis    PALPATION: TTP centrally L3-L4-L5, no soreness in glutes   07/02/22: Rt iliac crest and ASIS elevated in standing; L3-L5 hypomobility with pain PAIVM   LUMBAR ROM:  Active  A/PROM  eval  Flexion WNL  Extension 25%  Right lateral flexion 10  Left lateral flexion 10  Right rotation Min pain WNL   Left rotation Min pain WNL    (Blank rows = not tested)   LOWER EXTREMITY ROM:    WNL throughout LEs  AROM  Right eval Left eval  Hip flexion      Hip extension      Hip abduction      Hip adduction      Hip internal rotation      Hip external rotation      Knee flexion      Knee extension      Ankle dorsiflexion      Ankle plantarflexion      Ankle inversion      Ankle eversion       (Blank rows = not tested)   LOWER EXTREMITY MMT:     MMT Right eval Left eval Rt Lt.  Rt./Lt 07/18/22.   Hip flexion 4+ 4+ 5 5  5   Hip extension 3- 3- 4 4  4/4  Hip abduction 3- 3- 4 4+ 4/5  Hip adduction         Hip internal rotation         Hip external rotation         Knee flexion 5 5 5 5    Knee extension 5 5 5 5    Ankle dorsiflexion 5 5 5 5    Ankle plantarflexion         Ankle inversion         Ankle eversion          (Blank rows = not tested)    LUMBAR SPECIAL TESTS:  Straight leg raise test: Negative Neg Trendelenburg    FUNCTIONAL TESTS:  Squat : poor hip hinge, quad dominant   GAIT: Distance walked: 150 Assistive device utilized: None Level of assistance: I          Comments: no deviations noted    [facility] Adult PT Treatment:                                                DATE: 07/18/22 Therapeutic Exercise: Elliptical 5 min L1 resist and L 10 ramp Quadruped cat and cow x 10  Quadruped sidebending  x 10  Forearm hip extension 2 x 10 (extended and donkey) Anterior hip stretching followed by foam rolling to quads, TFL, ITB and piriformis  Supine neutral core: 90/90 hold with alt knee extension x 10  Dead bug 5 lbs each UE x 8 each side Seated anti rotation in V position  x 10  Palloff press yellow spring at springboard x 10  Self Care: Core, benefits and reasons for MRI, POC , degenerative disc, anatomy, etc.   [facility] Adult PT Treatment:                                                DATE: 07/09/22 Therapeutic Exercise: Prone Pressup x 10  Cat cow x 10  Prone pressup with strap x 10  Standing lumbar extension x 10  Supine crossover stretch x 30 sec each  Manual Therapy: L1-L5 CPAs grade II-IV STM bilateral  lumbar paraspinals; Rt gluteals/piriformis Lumbar manipulation performed by Carlus Pavlov, PT, DPT, ATC (no cavitation)  Trigger Point Dry Needling Treatment: Pre-treatment instruction: Patient instructed on dry needling rationale, procedures, and possible side effects including pain during treatment (achy,cramping feeling), bruising, drop of blood, lightheadedness, nausea, sweating. Patient Consent Given:  Yes Education handout provided: Previously provided Muscles treated: L2-5 multifidi  Treatment response/outcome: slight improvement in PAIVM with less pain  Post-treatment instructions: Patient instructed to expect possible mild to moderate muscle soreness later today and/or tomorrow. Patient instructed in methods to reduce muscle soreness and to continue prescribed HEP. If patient was dry needled over the lung field, patient was instructed on signs and symptoms of pneumothorax and, however unlikely, to see immediate medical attention should they occur. Patient was also educated on signs and symptoms of infection and to seek medical attention should they occur. Patient verbalized understanding of these instructions and education.    [facility] Adult PT Treatment:                                                DATE: 07/05/22 Therapeutic Exercise: Elliptical 5 min L1 resist and L 10 ramp  Articulating bridge x 10  Bridge with pelvic drop x 3 levels  Single leg bridge x 5 sec x 5  Knees crossed with rotation Rt.  over Rt. For Rt SIJ opening  Cat and cow x 5  Quadruped hip external x 15 then donkey kick x 15 Squat 25 lbs, 30 lbs x 15 RDL 25 lbs , 30 lbs x  15  Step ups 15 lbs x 15 each with hip hinge and opp leg knee lift , min increase in back pain with this  Manual Therapy: Manipulation lumbar spine Rt by Voncille Lo, PT    [facility] Adult PT Treatment:                                                DATE: 07/02/22 Therapeutic Exercise: Cat cow x 10 Prone pressup x 10  Sidelying thoracic rotation x 10 each  Figure 4 stretch 2 x 30 sec each  Prone hip extension 2 x 10  Manual Therapy: L1-L5 CPAs grade II-V (no cavitation)  Muscle energy technique to improve pelvic alignment.  Trigger Point Dry Needling Treatment: Pre-treatment instruction: Patient instructed on dry needling rationale, procedures, and possible side effects including pain during treatment (achy,cramping feeling), bruising, drop  of blood, lightheadedness, nausea, sweating. Patient Consent Given: Yes Education handout provided: Previously provided Muscles treated: L3-5 multifidi; Rt glute med/mmin   Treatment response/outcome:  slight improvement in PAIVM with less pain  Post-treatment instructions: Patient instructed to expect possible mild to moderate muscle soreness later today and/or tomorrow. Patient instructed in methods to reduce muscle soreness and to continue prescribed HEP. If patient was dry needled over the lung field, patient was instructed on signs and symptoms of pneumothorax and, however unlikely, to see immediate medical attention should they occur. Patient was also educated on signs and symptoms of infection and to seek medical attention should they occur. Patient verbalized understanding of these instructions and education.   [facility] Adult PT Treatment:  DATE: 06/28/22 Therapeutic Exercise: Bridge with band articulation  Banded march in bridge Banded bridge with 10 lbs  Hip flexor 1/2 kneeling  Dead lift on knees with and without 10 lbs dumbbells (less pain with wgts) Tall kneeling "matrix" x 10 with 10 lbs  Bird dog with arm and hip extension x 10 each side  Manual Therapy: Prone manual to L3-L4-L5, rotational mobs Gr II, decompression to Sacrum Sidelying caudal pressure to Rt pelvic crest for elongation  Self Care: Progress, goals, FOTO    [facility] Adult PT Treatment:                                                DATE: 06/26/22 Therapeutic Exercise: Supine pelvic tilts 2 x 10  Seated hip hinge with dowel x 10  Standing hip hinge with dowel x 10  Deadlift with 5 lbs 2 x 10 Deadlift with 10 lbs attempted increased pain. Resisted deadlift with green band x 10 Updated HEP  Manual Therapy: STM bilateral lumbar paraspinals CPAs grade II L-spine Trigger Point Dry Needling Treatment: Pre-treatment instruction: Patient instructed on dry needling rationale,  procedures, and possible side effects including pain during treatment (achy,cramping feeling), bruising, drop of blood, lightheadedness, nausea, sweating. Patient Consent Given: Yes Education handout provided: Yes Muscles treated: Rt lumbar paraspinals; Rt L4 Multifidi   Needle size and number: .30x63mm x 2 Electrical stimulation performed: No Parameters: N/A Treatment response/outcome: Twitch response elicited and Palpable decrease in muscle tension Post-treatment instructions: Patient instructed to expect possible mild to moderate muscle soreness later today and/or tomorrow. Patient instructed in methods to reduce muscle soreness and to continue prescribed HEP. If patient was dry needled over the lung field, patient was instructed on signs and symptoms of pneumothorax and, however unlikely, to see immediate medical attention should they occur. Patient was also educated on signs and symptoms of infection and to seek medical attention should they occur. Patient verbalized understanding of these instructions and education.    PATIENT EDUCATION:  Education details: TPDN   Person educated: Patient Education method: Explanation Education comprehension: verbalized understanding      HOME EXERCISE PROGRAM: Access Code: [email or web address] URL: [email or web address] Date: 06/08/2022 Prepared by: Raeford Razor  Exercises - Sidelying Hip Abduction  - 2 x daily - 7 x weekly - 2 sets - 10 reps - 5 hold - Clamshell with Resistance  - 2 x daily - 7 x weekly - 2 sets - 10 reps - 5 hold - Quadruped on Forearms Hip Extension  - 2 x daily - 7 x weekly - 2 sets - 10 reps - 5 hold - Squat with Chair Touch  - 2 x daily - 7 x weekly - 2 sets - 10 reps - 5 hold - Standing Hip Abduction with Counter Support  - 1 x daily - 7 x weekly - 2 sets - 10 reps - 30 hold - Reverse Lunge on Slider  - 1 x daily - 7 x weekly - 2 sets - 10 reps - Standing Quadriceps Stretch  - 1 x daily - 7 x weekly - 1 sets - 3 reps -  30 hold - Supine Hamstring Stretch with Strap  - 1 x daily - 7 x weekly - 1 sets - 3 reps - 30 hold - Supine ITB Stretch with Strap  - 1 x daily - 7 x weekly -  1 sets - 3 reps - 30 hold - Supine Piriformis Stretch with Leg Straight  - 1 x daily - 7 x weekly - 1 sets - 3 reps - 30 hold  ASSESSMENT:   CLINICAL IMPRESSION: Spine mobility addressed in addition to core strength and stability.  Lisa Bradford continues to have moderate amount of low back pain which can still interfere with her recreation and exercise routine. She would ike to see what happens after the MRI before deciding on DC or renewal.  She continues to have stiffness in lumbar spine.  L ITB tighter than Rt.  Limited hip extension due to strength/glute activation and /or anterior hip/quad tightness.  Advised her that building her core is the main thing that will help reduce her back pain in the long term.  She understands concepts well.  She may return for only a few visits for dry needling but again, will reach out to [place] regarding her wishes.   OBJECTIVE IMPAIRMENTS decreased mobility, difficulty walking, decreased ROM, decreased strength, hypomobility, increased fascial restrictions, postural dysfunction, and pain.    ACTIVITY LIMITATIONS standing, squatting, and locomotion level   PARTICIPATION LIMITATIONS: shopping, community activity, occupation, and fitness   PERSONAL FACTORS Time since onset of injury/illness/exacerbation are also affecting patient's functional outcome.    REHAB POTENTIAL: Excellent   CLINICAL DECISION MAKING: Stable/uncomplicated   EVALUATION COMPLEXITY: Low     GOALS:     LONG TERM GOALS: Target date: 07/20/2022   Pt will be I with HEP for hips, core  Baseline:  Goal status: MET THUS FAR , given more core today.    2.  Pt will be able to show 4/5 or better strength in hips  Baseline: 3-/5 to 3:  Goal status: MET, still addressing    3.  Pt will improve FOTO core to 65% or better to show  functional improvement Baseline: update 50% Goal status: improved, DC FOTO.    4.  Pt will be able to squat with 20-25 lbs and no increase in back pain .  Baseline: update: better form and less pain , depends on the day  Goal status: ongoing    5.  Pt will be able to walk for 30 min with min increase in back pain < 3/10  Baseline: update has not walked much , walking increases pain intermittently.  Goal status: ongoing     PLAN: PT FREQUENCY: 2x/week   PT DURATION: 6 weeks   PLANNED INTERVENTIONS: Therapeutic exercises, Therapeutic activity, Neuromuscular re-education, Balance training, Patient/Family education, Self Care, Joint mobilization, Spinal mobilization, Cryotherapy, Moist heat, Manual therapy, and Re-evaluation.   PLAN FOR NEXT SESSION: Renew vs DC>  MRI results.  Raeford Razor, PT 07/18/22 11:58 AM Phone: [phone] Fax: [phone]    PHYSICAL THERAPY DISCHARGE SUMMARY  Visits from Start of Care: 11  Current functional level related to goals / functional outcomes: See above    Remaining deficits: Glute weakness   Education / Equipment: HEP, lifting, squats    Patient agrees to discharge. Patient goals were partially met. Patient is being discharged due to not returning since the last visit. DID not return after MRI.   Raeford Razor, PT 09/18/22 11:30 AM Phone: [phone] Fax: [phone]

## 2022-07-18 ENCOUNTER — Encounter: Payer: Self-pay | Admitting: Physical Therapy

## 2022-07-18 ENCOUNTER — Ambulatory Visit: Payer: PRIVATE HEALTH INSURANCE | Admitting: Physical Therapy

## 2022-07-18 DIAGNOSIS — M5459 Other low back pain: Secondary | ICD-10-CM

## 2022-07-18 DIAGNOSIS — M6281 Muscle weakness (generalized): Secondary | ICD-10-CM

## 2022-07-18 DIAGNOSIS — M2560 Stiffness of unspecified joint, not elsewhere classified: Secondary | ICD-10-CM

## 2022-07-20 ENCOUNTER — Ambulatory Visit: Payer: No Typology Code available for payment source | Admitting: Sports Medicine

## 2022-07-29 ENCOUNTER — Ambulatory Visit (INDEPENDENT_AMBULATORY_CARE_PROVIDER_SITE_OTHER): Payer: No Typology Code available for payment source

## 2022-07-29 DIAGNOSIS — M545 Low back pain, unspecified: Secondary | ICD-10-CM | POA: Diagnosis not present

## 2022-07-29 DIAGNOSIS — M9905 Segmental and somatic dysfunction of pelvic region: Secondary | ICD-10-CM

## 2022-07-29 DIAGNOSIS — M9903 Segmental and somatic dysfunction of lumbar region: Secondary | ICD-10-CM

## 2022-07-29 DIAGNOSIS — G8929 Other chronic pain: Secondary | ICD-10-CM

## 2022-07-29 DIAGNOSIS — M9904 Segmental and somatic dysfunction of sacral region: Secondary | ICD-10-CM

## 2022-08-01 NOTE — Progress Notes (Unsigned)
    [name] D.[name] [address] [place] [address] Phone: [phone]   Assessment and Plan:     There are no diagnoses linked to this encounter.  ***   Pertinent previous records reviewed include ***   Follow Up: ***     Subjective:   I, Lisa Bradford, am serving as a Neurosurgeon for Lisa Bradford   Chief Complaint: mid/low back pain   HPI:  05/10/2022 Patient is a 51 year old female complaining of mid/ low back pain. Patient states that she has had lbp more than a year that has gotten worse feb went to a chiro and that helped but the pain is coming back, she goes about once a month the pain is worst now since she has been going once a month , pain is mostly on the right side , no numbness or tingling, does get radiating pain to the hip sometimes a sharp stabbing pain , wasn't able to stand or walk for long periods of time is affecting ADLs, has not been taking any meds for the pain    05/31/2022 Patient states that the pain is a little bit better but it is still there , hurts more today than yesterday , is able to get lower in her squats    07/11/2022 Patient states Pt has helped , pain not all the way gone but has def helped    08/02/2022 Patient states   Relevant Historical Information: None pertinent  Additional pertinent review of systems negative.   Current Outpatient Medications:    cyclobenzaprine (FLEXERIL) 5 MG tablet, Take 1 tablet (5 mg total) by mouth at bedtime., Disp: 15 tablet, Rfl: 0   levonorgestrel (MIRENA) 20 MCG/24HR IUD, 1 each by Intrauterine route once., Disp: , Rfl:    levothyroxine (SYNTHROID) 100 MCG tablet, Take 1 tablet (100 mcg total) by mouth daily., Disp: 90 tablet, Rfl: 3   meloxicam (MOBIC) 15 MG tablet, Take 1 tablet (15 mg total) by mouth daily., Disp: 30 tablet, Rfl: 0   omeprazole (PRILOSEC) 20 MG capsule, Take 20 mg by mouth daily., Disp: , Rfl:    Objective:     There were  no vitals filed for this visit.    There is no height or weight on file to calculate BMI.    Physical Exam:    ***   Electronically signed by:  [name] D.[name] 7:51 AM 08/01/22

## 2022-08-02 ENCOUNTER — Ambulatory Visit (INDEPENDENT_AMBULATORY_CARE_PROVIDER_SITE_OTHER): Payer: No Typology Code available for payment source | Admitting: Sports Medicine

## 2022-08-02 VITALS — BP 110/78 | HR 66 | Ht 65.0 in | Wt 154.0 lb

## 2022-08-02 DIAGNOSIS — M545 Low back pain, unspecified: Secondary | ICD-10-CM

## 2022-08-02 DIAGNOSIS — M47816 Spondylosis without myelopathy or radiculopathy, lumbar region: Secondary | ICD-10-CM

## 2022-08-02 DIAGNOSIS — G8929 Other chronic pain: Secondary | ICD-10-CM

## 2022-08-02 NOTE — Patient Instructions (Addendum)
Good to see you  Shoulder HEP Epidural right L5-S1 Follow up 2 weeks up after you injection to discuss results

## 2022-08-20 ENCOUNTER — Inpatient Hospital Stay: Admission: RE | Admit: 2022-08-20 | Payer: No Typology Code available for payment source | Source: Ambulatory Visit

## 2022-08-22 ENCOUNTER — Other Ambulatory Visit: Payer: No Typology Code available for payment source

## 2023-02-04 ENCOUNTER — Encounter: Payer: Self-pay | Admitting: Internal Medicine

## 2023-07-03 ENCOUNTER — Telehealth: Payer: Self-pay | Admitting: Internal Medicine

## 2023-07-03 NOTE — Telephone Encounter (Signed)
Requests refill levothyroxine (SYNTHROID) 100 MCG tablet sent to  [facility], [address] [place], [place], [address] (patient unsure of address, address came from [employer])

## 2023-07-04 NOTE — Telephone Encounter (Signed)
Pt called with Fax [phone]. Pt states she is out of the medication

## 2023-07-08 ENCOUNTER — Other Ambulatory Visit: Payer: Self-pay | Admitting: Family

## 2023-07-08 MED ORDER — LEVOTHYROXINE SODIUM 100 MCG PO TABS: 100.0000 ug | ORAL_TABLET | Freq: Every day | ORAL | 3 refills | Status: DC

## 2023-07-31 IMAGING — DX DG FOOT COMPLETE 3+V*R*
3 series · 3 of 3 positions shown · non-contrast
Comparison: None.

CLINICAL DATA: Trauma to the right foot.

EXAM:
RIGHT FOOT COMPLETE - 3+ VIEW

[foot ap]
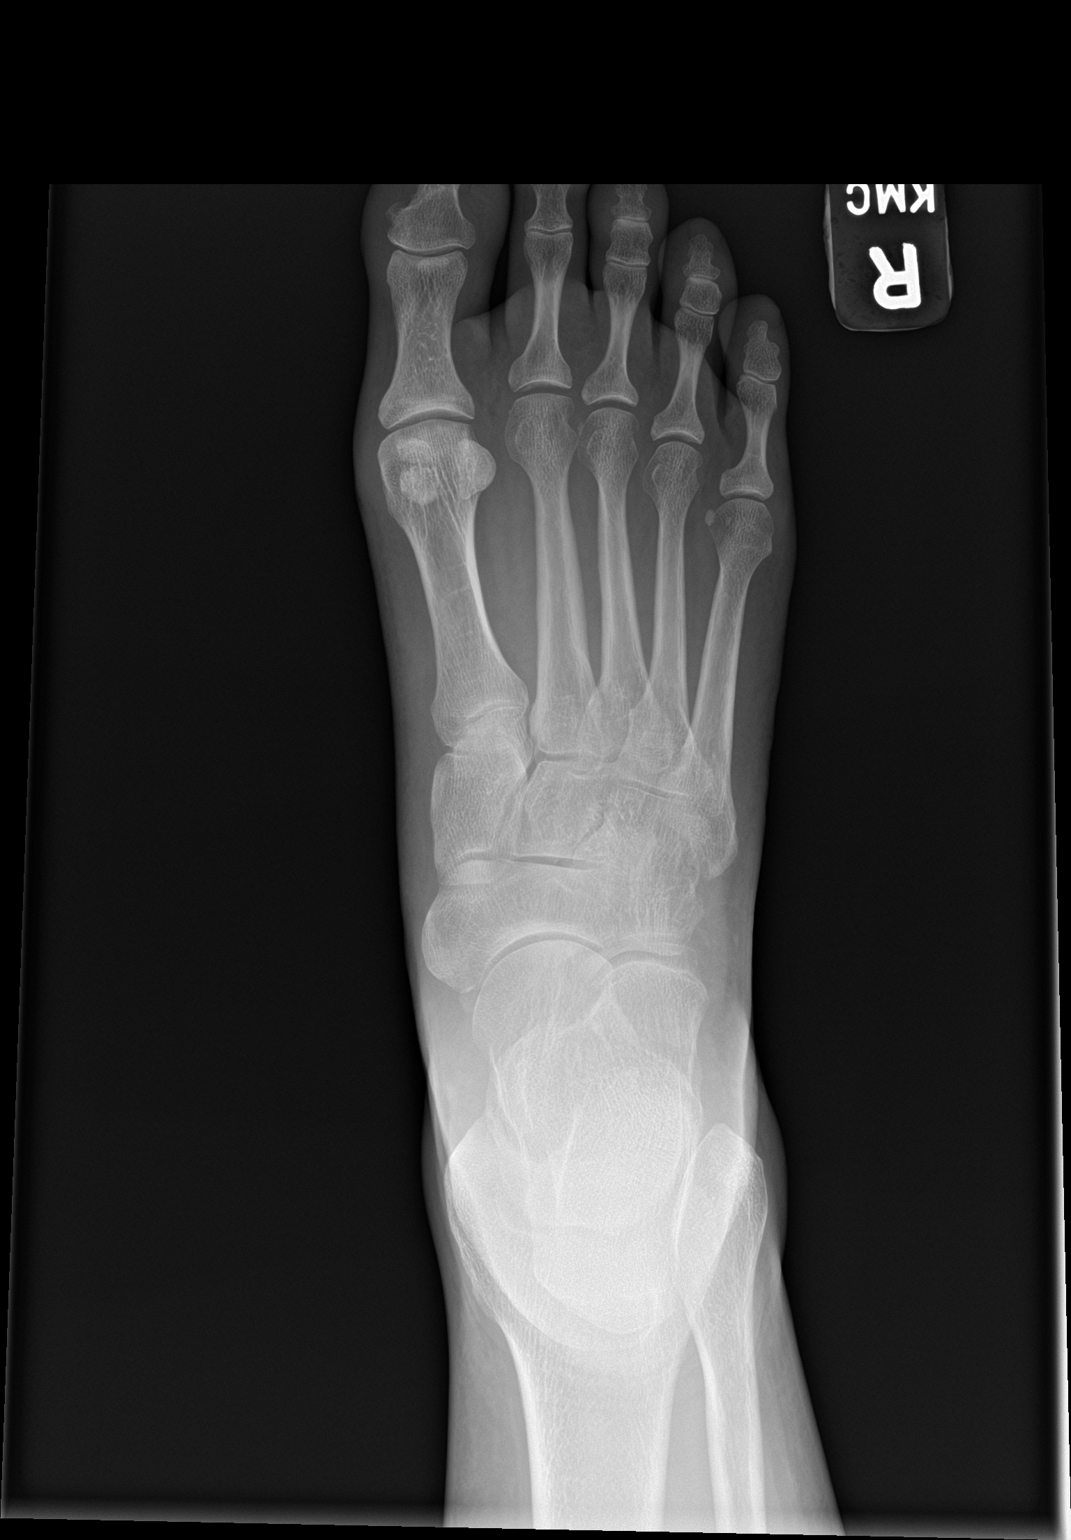

[foot obl]
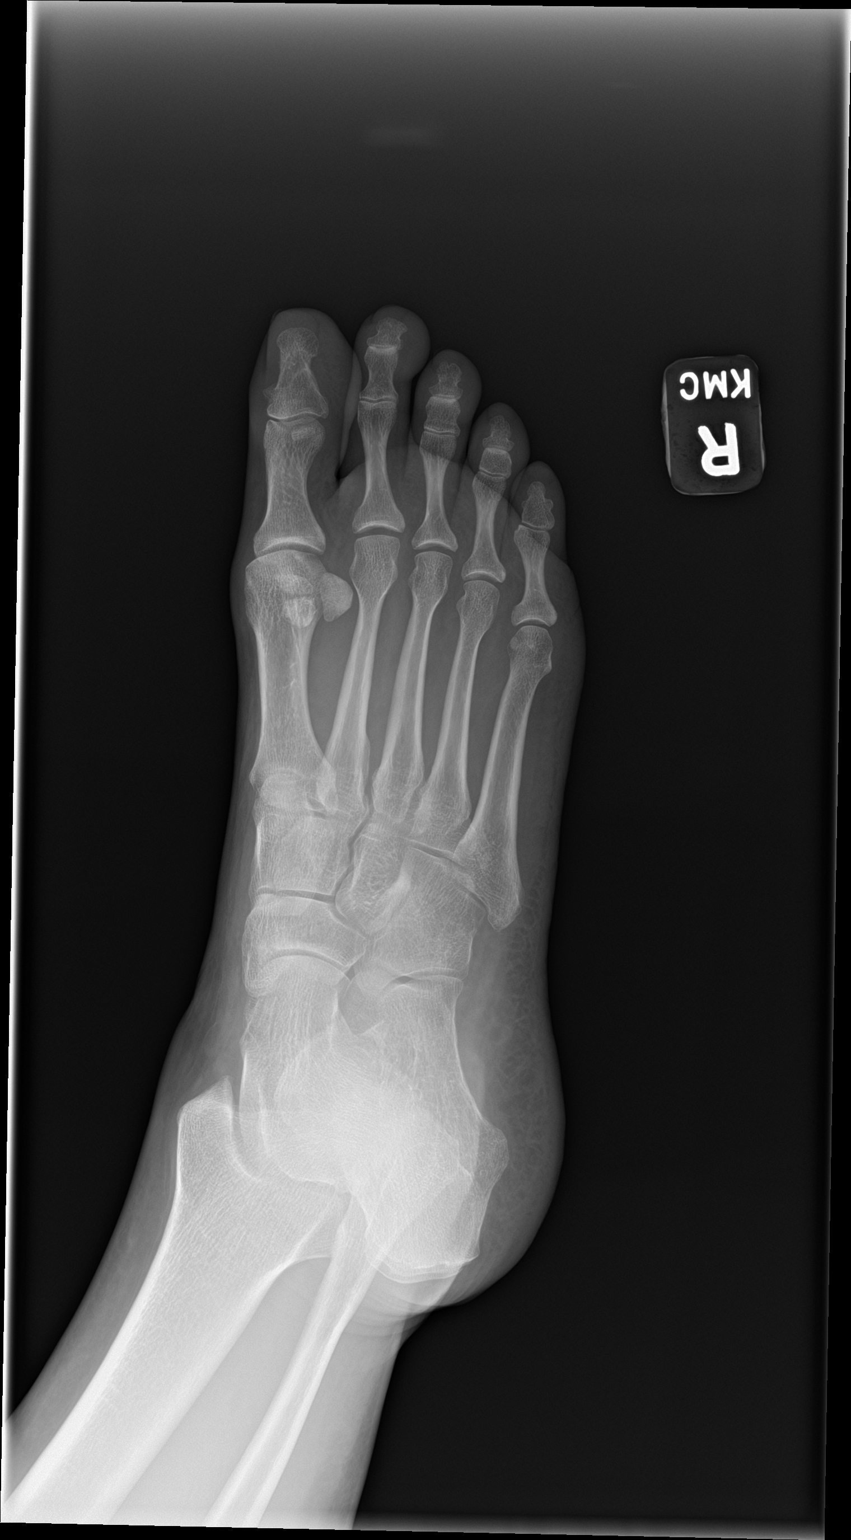

[foot lat]
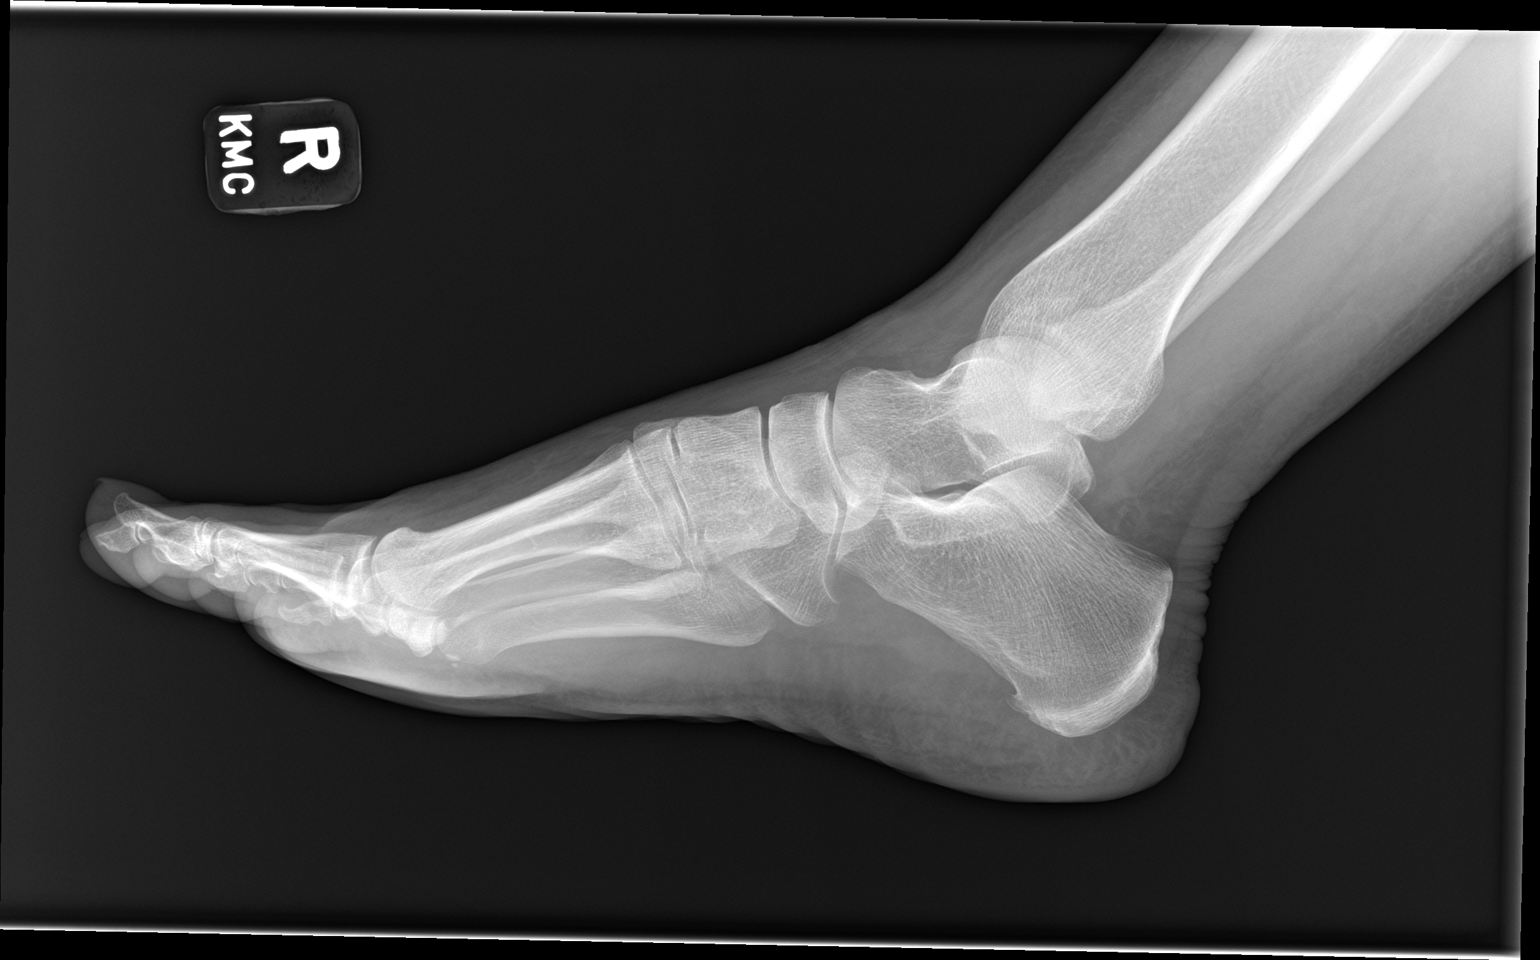

[3 of 3 positions shown; findings below may reference images not displayed]

FINDINGS: There is a small triangular bone fragment along the posterior aspect
of the medial navicular likely representing an os navicularis.
Correlation with point tenderness recommended to exclude an acute
fracture. No other fracture identified. There is no dislocation. The
bones are well mineralized. No arthritic changes. The soft tissues
are unremarkable.
IMPRESSION: Os navicularis versus less likely a small acute fracture of the
medial navicular.

## 2024-07-13 ENCOUNTER — Other Ambulatory Visit: Payer: Self-pay | Admitting: Family

## 2024-08-19 ENCOUNTER — Ambulatory Visit (INDEPENDENT_AMBULATORY_CARE_PROVIDER_SITE_OTHER): Payer: Self-pay | Admitting: Internal Medicine

## 2024-08-19 ENCOUNTER — Encounter: Payer: Self-pay | Admitting: Internal Medicine

## 2024-08-19 VITALS — BP 124/86 | HR 66 | Temp 98.2°F | Ht 63.43 in | Wt 161.2 lb

## 2024-08-19 DIAGNOSIS — Z0000 Encounter for general adult medical examination without abnormal findings: Secondary | ICD-10-CM

## 2024-08-19 DIAGNOSIS — Z1211 Encounter for screening for malignant neoplasm of colon: Secondary | ICD-10-CM

## 2024-08-19 DIAGNOSIS — E039 Hypothyroidism, unspecified: Secondary | ICD-10-CM

## 2024-08-19 MED ORDER — LEVOTHYROXINE SODIUM 100 MCG PO TABS: 100.0000 ug | ORAL_TABLET | Freq: Every day | ORAL | 3 refills | Status: DC

## 2024-08-19 NOTE — Patient Instructions (Signed)
 Good to see you Keep up lifestyle intervention healthy eating and exercise .  Lab update today .   Refilled thyroid  medication.

## 2024-08-19 NOTE — Progress Notes (Signed)
 Chief Complaint  Patient presents with   Annual Exam    HPI: Patient  Lisa Bradford  53 y.o. comes in today for Preventive Health Care visit   Hypothyroid still taking same med  no other sig change in med hx  No major injury  Has iud  is peri post menopausal and will prob have removed soon   Health Maintenance  Topic Date Due   Hepatitis B Vaccines 19-59 Average Risk (1 of 3 - 19+ 3-dose series) Never done   Pneumococcal Vaccine: 50+ Years (1 of 1 - PCV) Never done   COVID-19 Vaccine (4 - 2025-26 season) 09/04/2024 (Originally 05/25/2024)   Colonoscopy  10/19/2024 (Originally 03/21/2015)   Zoster Vaccines- Shingrix (1 of 2) 11/19/2024 (Originally 03/20/1989)   Influenza Vaccine  12/22/2024 (Originally 04/24/2024)   Mammogram  02/16/2025 (Originally 01/19/2024)   Hepatitis C Screening  08/19/2025 (Originally 03/20/1988)   HIV Screening  08/19/2025 (Originally 03/20/1985)   Cervical Cancer Screening (HPV/Pap Cotest)  01/24/2027   DTaP/Tdap/Td (6 - Td or Tdap) 04/25/2032   HPV VACCINES  Aged Out   Meningococcal B Vaccine  Aged Out   Health Maintenance Review LIFESTYLE:  Exercise:   work out daily 4  d per week  Tobacco/ETS: no Alcohol:  q 2 weeks  Sugar beverages:  sweet tea  2 x  per week.  Sleep:   abou 6-7  Drug use: no HH of  1   1 dog  Work:  8 hours  40 per week.  Plans  to be married     ROS:  GEN/ HEENT: No fever, significant weight changes sweats headaches vision problems hearing changes, CV/ PULM; No chest pain shortness of breath cough, syncope,edema  change in exercise tolerance. GI /GU: No adominal pain, vomiting, change in bowel habits. No blood in the stool. No significant GU symptoms. SKIN/HEME: ,no acute skin rashes suspicious lesions or bleeding. No lymphadenopathy, nodules, masses.  NEURO/ PSYCH:  No neurologic signs such as weakness numbness. No depression anxiety. IMM/ Allergy: No unusual infections.  Allergy .   REST of 12 system review negative  except as per HPI   Past Medical History:  Diagnosis Date   Allergy    GERD (gastroesophageal reflux disease) 12/26/2011   Heart murmur    echo in 98   Hypothyroidism    pos antibodies  goiter    THROMBOCYTOPENIA 02/01/2009   Qualifier: Diagnosis of  By: Charlett MD, Apolinar POUR     Past Surgical History:  Procedure Laterality Date   CERVICAL POLYPECTOMY      Family History  Problem Relation Age of Onset   Cancer Mother        ovarian and cervical   Breast cancer Mother    Cancer Maternal Grandmother        lung   Cancer Maternal Grandfather        lung and pancreatic   Diabetes Maternal Grandfather    Coronary artery disease Father        stent   Diabetes Father    Diabetes Sister    Breast cancer Maternal Aunt    Diabetes Paternal Grandmother     Social History   Socioeconomic History   Marital status: Single    Spouse name: Not on file   Number of children: 0   Years of education: Not on file   Highest education level: Bachelor's degree (e.g., BA, AB, BS)  Occupational History   Not on file  Tobacco Use  Smoking status: Never   Smokeless tobacco: Never  Vaping Use   Vaping status: Never Used  Substance and Sexual Activity   Alcohol use: Yes    Alcohol/week: 0.0 standard drinks of alcohol    Comment: once every 2 weeks   Drug use: No   Sexual activity: Not Currently    Birth control/protection: I.U.D.  Other Topics Concern   Not on file  Social History Narrative   HH of 1   Pet cat.   ocass exercise .   No tob    rare etoh   caffiene  2 per day   Work  40 hours per week.    Good sleep.    Social Drivers of Corporate Investment Banker Strain: Not on file  Food Insecurity: Not on file  Transportation Needs: No Transportation Needs (05/05/2019)   PRAPARE - Administrator, Civil Service (Medical): No    Lack of Transportation (Non-Medical): No  Physical Activity: Not on file  Stress: Not on file  Social Connections: Not on file     Outpatient Medications Prior to Visit  Medication Sig Dispense Refill   levonorgestrel (MIRENA) 20 MCG/24HR IUD 1 each by Intrauterine route once.     levothyroxine  (SYNTHROID ) 100 MCG tablet Take 1 tablet (100 mcg total) by mouth daily. 90 tablet 3   cyclobenzaprine  (FLEXERIL ) 5 MG tablet Take 1 tablet (5 mg total) by mouth at bedtime. (Patient not taking: Reported on 08/19/2024) 15 tablet 0   meloxicam  (MOBIC ) 15 MG tablet Take 1 tablet (15 mg total) by mouth daily. (Patient not taking: Reported on 08/19/2024) 30 tablet 0   omeprazole (PRILOSEC) 20 MG capsule Take 20 mg by mouth daily. (Patient not taking: Reported on 08/19/2024)     No facility-administered medications prior to visit.     EXAM:  BP 124/86 (BP Location: Left Arm, Patient Position: Sitting, Cuff Size: Normal)   Pulse 66   Temp 98.2 F (36.8 C) (Oral)   Ht 5' 3.43 (1.611 m)   Wt 161 lb 3.2 oz (73.1 kg)   SpO2 98%   BMI 28.17 kg/m   Body mass index is 28.17 kg/m. Wt Readings from Last 3 Encounters:  08/19/24 161 lb 3.2 oz (73.1 kg)  08/02/22 154 lb (69.9 kg)  07/11/22 154 lb (69.9 kg)    Physical Exam: Vital signs reviewed HZW:Uypd is a well-developed well-nourished alert cooperative    who appearsr stated age in no acute distress.  HEENT: normocephalic atraumatic , Eyes: PERRL EOM's full, conjunctiva clear, Nares: paten,t no deformity discharge or tenderness., Ears: no deformity EAC's clear TMs with normal landmarks. Mouth: clear OP, no lesions, edema.  Moist mucous membranes. Dentition in adequate repair. NECK: supple without masses, thyromegaly or bruits. CHEST/PULM:  Clear to auscultation and percussion breath sounds equal no wheeze , rales or rhonchi. Breast: normal by inspection . No dimpling, discharge, masses, tenderness or discharge . CV: PMI is nondisplaced, S1 S2 no gallops, murmurs, rubs. Peripheral pulses are full without delay.No JVD .  ABDOMEN: Bowel sounds normal nontender  No guard or  rebound, no hepato splenomegal no CVA tenderness.   Extremtities:  No clubbing cyanosis or edema, no acute joint swelling or redness no focal atrophy NEURO:  Oriented x3, cranial nerves 3-12 appear to be intact, no obvious focal weakness,gait within normal limits no abnormal reflexes or asymmetrical SKIN: No acute rashes normal turgor, color, no bruising or petechiae. PSYCH: Oriented, good eye contact, no obvious depression anxiety,  cognition and judgment appear normal. LN: no cervical axillary adenopathy  Lab Results  Component Value Date   WBC 6.7 04/25/2022   HGB 13.4 04/25/2022   HCT 39.9 04/25/2022   PLT 158.0 04/25/2022   GLUCOSE 79 04/25/2022   CHOL 170 04/25/2022   TRIG 112.0 04/25/2022   HDL 60.90 04/25/2022   LDLDIRECT 96.0 08/08/2016   LDLCALC 87 04/25/2022   ALT 22 04/25/2022   AST 24 04/25/2022   NA 139 04/25/2022   K 4.0 04/25/2022   CL 101 04/25/2022   CREATININE 1.03 04/25/2022   BUN 29 (H) 04/25/2022   CO2 30 04/25/2022   TSH 1.42 04/25/2022   HGBA1C 5.9 04/25/2022    BP Readings from Last 3 Encounters:  08/19/24 124/86  08/02/22 110/78  07/11/22 110/68    Lab plan reviewed with patient   ASSESSMENT AND PLAN:  Discussed the following assessment and plan:    ICD-10-CM   1. Visit for preventive health examination  Z00.00 CBC with Differential/Platelet    Comprehensive metabolic panel with GFR    Lipid panel    TSH    Cologuard    2. Hypothyroidism, unspecified type  E03.9 CBC with Differential/Platelet    Comprehensive metabolic panel with GFR    Lipid panel    TSH    3. Screening for colon cancer  Z12.11 Cologuard    Updating labs  she wants to repeat lipid panel also  since has gained some weight  She does regular exercise routine at home. And to continue. Immuniz state record checked she had hep a vaccine but not hep B consider getting at [facility] if wishes  and flu vaccine pharmacy other etc  No reg insurance but similar to [facility] plan  Return in  about 1 year (around 08/19/2025) for depending on results.  Patient Care Team: Chesley Valls K, MD as PCP - Diedre Henry Slough, MD (Obstetrics and Gynecology) Leslee Reusing, MD (Ophthalmology) dermatologist Patient Instructions  Good to see you Keep up lifestyle intervention healthy eating and exercise .  Lab update today .   Refilled thyroid  medication.  Averiana Clouatre K. Brizeyda Holtmeyer M.D.

## 2024-08-20 LAB — LIPID PANEL
Cholesterol: 177 mg/dL (ref <200)
HDL: 63 mg/dL (ref >=50)
LDL Cholesterol (Calc): 92 mg/dL
Non-HDL Cholesterol (Calc): 114 mg/dL (ref <130)
Total CHOL/HDL Ratio: 2.8 (calc) (ref <5.0)
Triglycerides: 123 mg/dL (ref <150)

## 2024-08-20 LAB — CBC WITH DIFFERENTIAL/PLATELET
Absolute Lymphocytes: 2310 {cells}/uL (ref 850–3900)
Absolute Monocytes: 515 {cells}/uL (ref 200–950)
Basophils Absolute: 33 {cells}/uL (ref 0–200)
Basophils Relative: 0.5 %
Eosinophils Absolute: 139 {cells}/uL (ref 15–500)
Eosinophils Relative: 2.1 %
HCT: 38.3 % (ref 35.9–46.0)
Hemoglobin: 12.9 g/dL (ref 11.7–15.5)
MCH: 29.9 pg (ref 27.0–33.0)
MCHC: 33.7 g/dL (ref 31.6–35.4)
MCV: 88.9 fL (ref 81.4–101.7)
MPV: 11.2 fL (ref 7.5–12.5)
Monocytes Relative: 7.8 %
Neutro Abs: 3604 {cells}/uL (ref 1500–7800)
Neutrophils Relative %: 54.6 %
Platelets: 187 Thousand/uL (ref 140–400)
RBC: 4.31 Million/uL (ref 3.80–5.10)
RDW: 11.8 % (ref 11.0–15.0)
Total Lymphocyte: 35 %
WBC: 6.6 Thousand/uL (ref 3.8–10.8)

## 2024-08-20 LAB — COMPREHENSIVE METABOLIC PANEL WITH GFR
AG Ratio: 1.7 (calc) (ref 1.0–2.5)
ALT: 18 U/L (ref 6–29)
AST: 21 U/L (ref 10–35)
Albumin: 4.3 g/dL (ref 3.6–5.1)
Alkaline phosphatase (APISO): 50 U/L (ref 37–153)
BUN: 21 mg/dL (ref 7–25)
CO2: 28 mmol/L (ref 20–32)
Calcium: 9.5 mg/dL (ref 8.6–10.4)
Chloride: 104 mmol/L (ref 98–110)
Creat: 0.99 mg/dL (ref 0.50–1.03)
Globulin: 2.5 g/dL (ref 1.9–3.7)
Glucose, Bld: 93 mg/dL (ref 65–99)
Potassium: 4 mmol/L (ref 3.5–5.3)
Sodium: 140 mmol/L (ref 135–146)
Total Bilirubin: 0.4 mg/dL (ref 0.2–1.2)
Total Protein: 6.8 g/dL (ref 6.1–8.1)
eGFR: 68 mL/min/1.73m2 (ref >=60)

## 2024-08-20 LAB — TSH: TSH: 0.37 m[IU]/L — ABNORMAL LOW

## 2024-08-25 ENCOUNTER — Ambulatory Visit: Payer: Self-pay | Admitting: Internal Medicine

## 2024-08-25 DIAGNOSIS — E039 Hypothyroidism, unspecified: Secondary | ICD-10-CM

## 2024-08-25 NOTE — Progress Notes (Signed)
 Labs in range except  tsh  over suppressed.  I suggest we decrease thyroid  dose to take  6 days per week and off one day .( You pick) update med list instructions  and  then recheck tsh  free t4 in 3 months  , do not have to fast.

## 2024-08-28 MED ORDER — LEVOTHYROXINE SODIUM 100 MCG PO TABS: 100.0000 ug | ORAL_TABLET | Freq: Every day | ORAL | Status: AC

## 2024-09-10 LAB — COLOGUARD: COLOGUARD: NEGATIVE

## 2024-11-26 ENCOUNTER — Other Ambulatory Visit: Payer: Self-pay
